# Patient Record
Sex: Female | Born: 1960 | Race: White | Hispanic: No | Marital: Married | Smoking: Never smoker
Health system: Southern US, Community
[De-identification: ages and names within clinical notes are randomized; demographics above are authoritative.]

## PROBLEM LIST (undated history)

## (undated) DIAGNOSIS — E0500 Thyrotoxicosis with diffuse goiter without thyrotoxic crisis or storm: Secondary | ICD-10-CM

## (undated) HISTORY — PX: OTHER SURGICAL HISTORY: SHX169

## (undated) HISTORY — PX: TONSILLECTOMY: SUR1361

---

## 1998-08-24 ENCOUNTER — Other Ambulatory Visit: Admission: RE | Admit: 1998-08-24 | Discharge: 1998-08-24 | Payer: Self-pay | Admitting: Radiology

## 1999-10-04 ENCOUNTER — Other Ambulatory Visit: Admission: RE | Admit: 1999-10-04 | Discharge: 1999-10-04 | Payer: Self-pay | Admitting: Obstetrics and Gynecology

## 2000-11-03 ENCOUNTER — Other Ambulatory Visit: Admission: RE | Admit: 2000-11-03 | Discharge: 2000-11-03 | Payer: Self-pay | Admitting: Obstetrics and Gynecology

## 2002-02-02 ENCOUNTER — Other Ambulatory Visit: Admission: RE | Admit: 2002-02-02 | Discharge: 2002-02-02 | Payer: Self-pay | Admitting: Obstetrics and Gynecology

## 2003-07-05 ENCOUNTER — Other Ambulatory Visit: Admission: RE | Admit: 2003-07-05 | Discharge: 2003-07-05 | Payer: Self-pay | Admitting: Obstetrics and Gynecology

## 2006-11-23 ENCOUNTER — Emergency Department (HOSPITAL_COMMUNITY): Admission: EM | Admit: 2006-11-23 | Discharge: 2006-11-23 | Payer: Self-pay | Admitting: Emergency Medicine

## 2007-09-20 IMAGING — CR DG ANKLE COMPLETE 3+V*L*
3 series · 3 of 3 positions shown · non-contrast
Comparison: None.

CLINICAL DATA: 45-year-old with ankle injury.  Diffuse left ankle pain after turning it today. 
LEFT ANKLE -  3 VIEW:

[t ankle joint ap left]
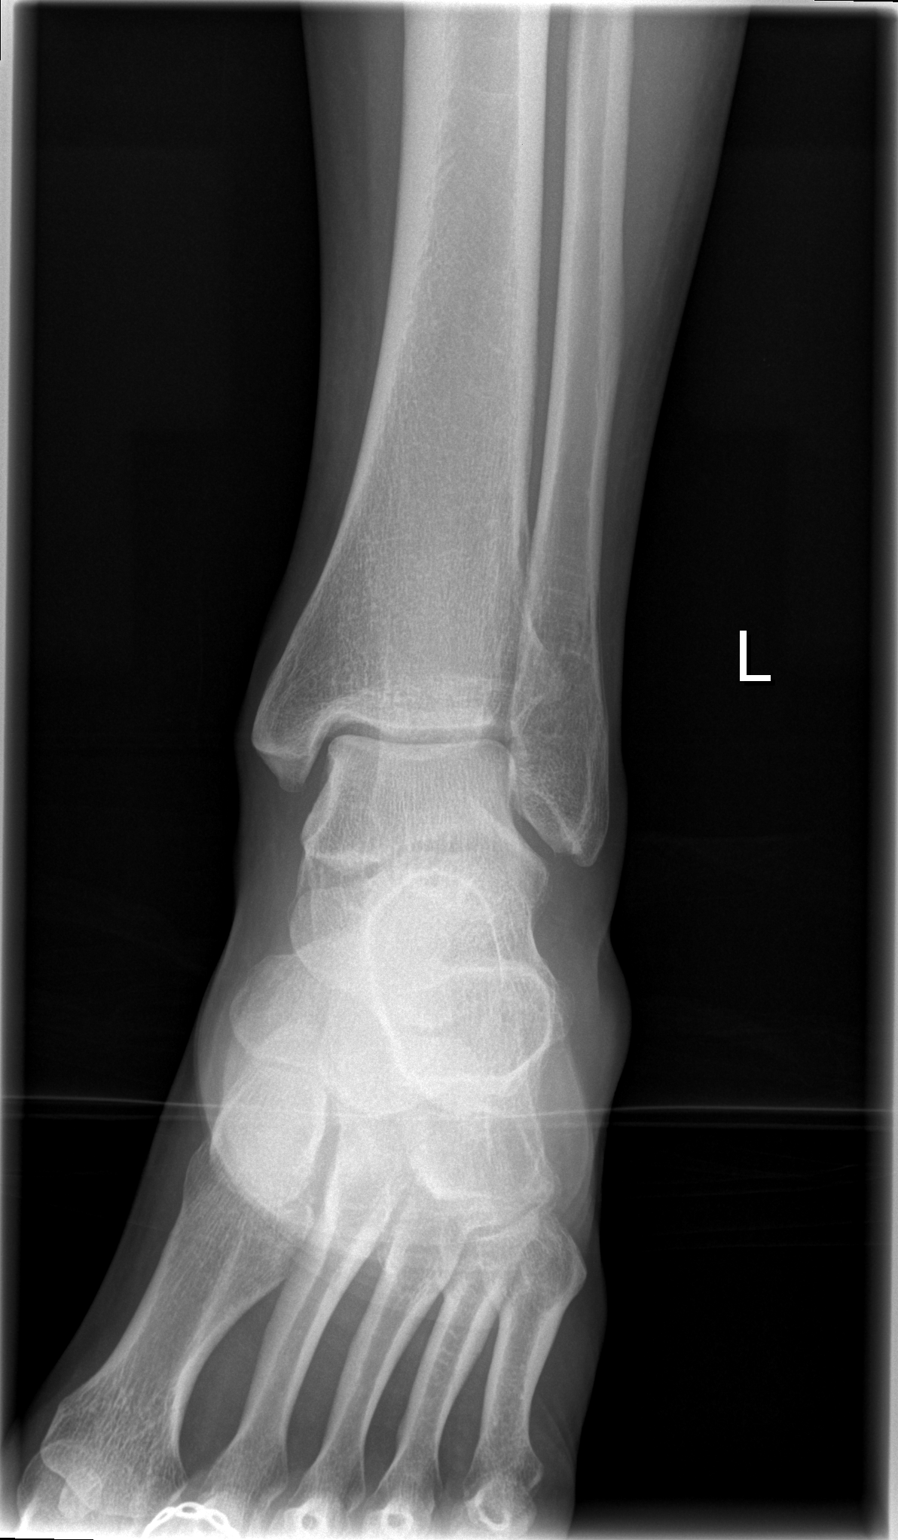

[t ankle joint oblique left]
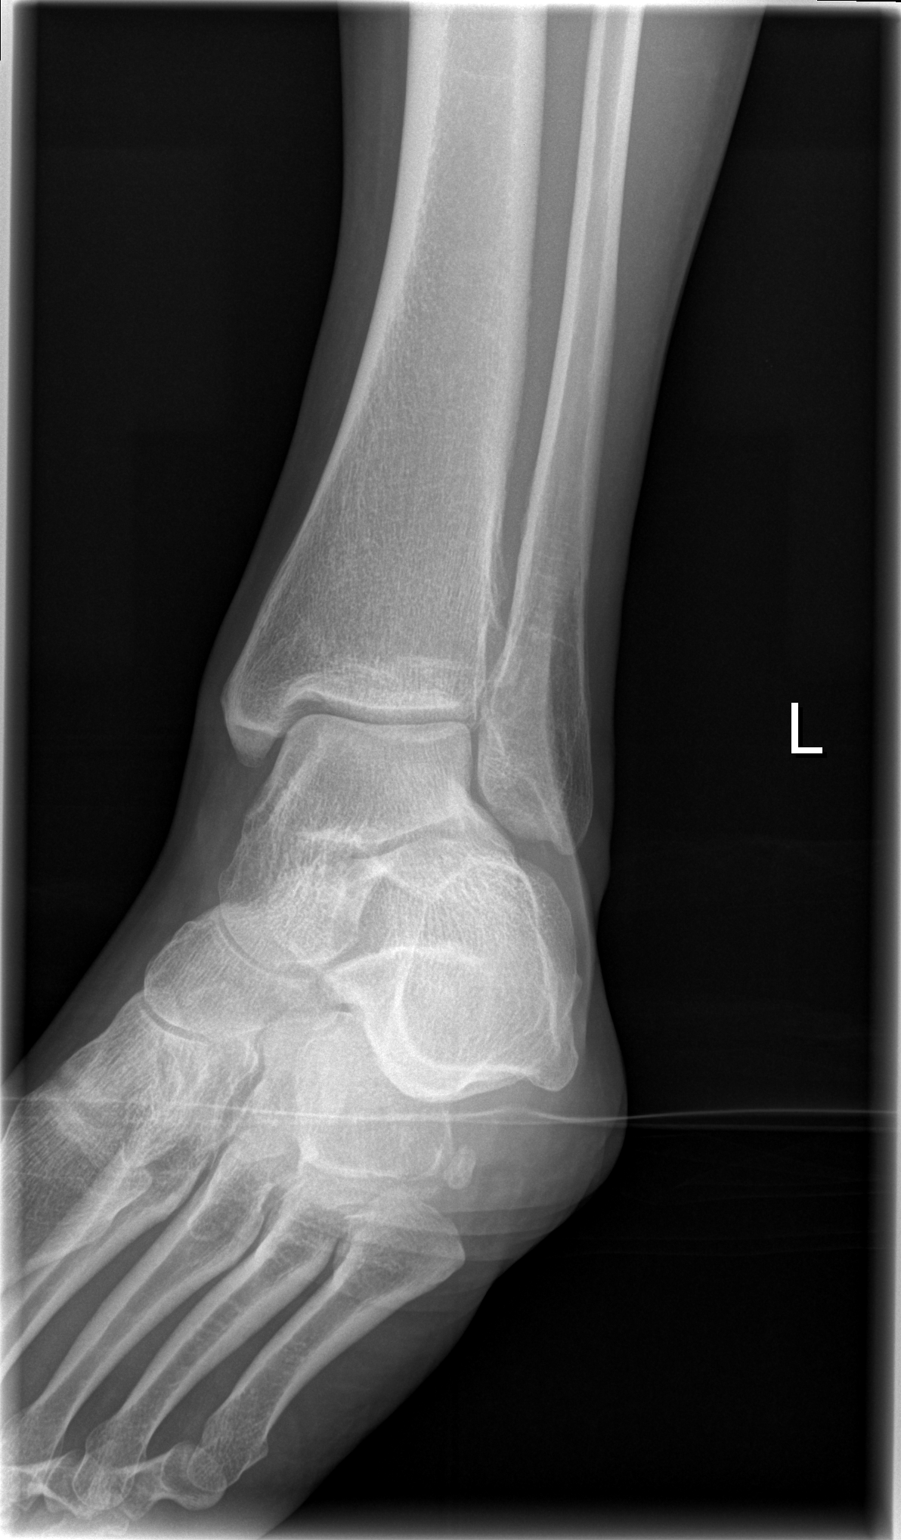

[t ankle joint lat left]
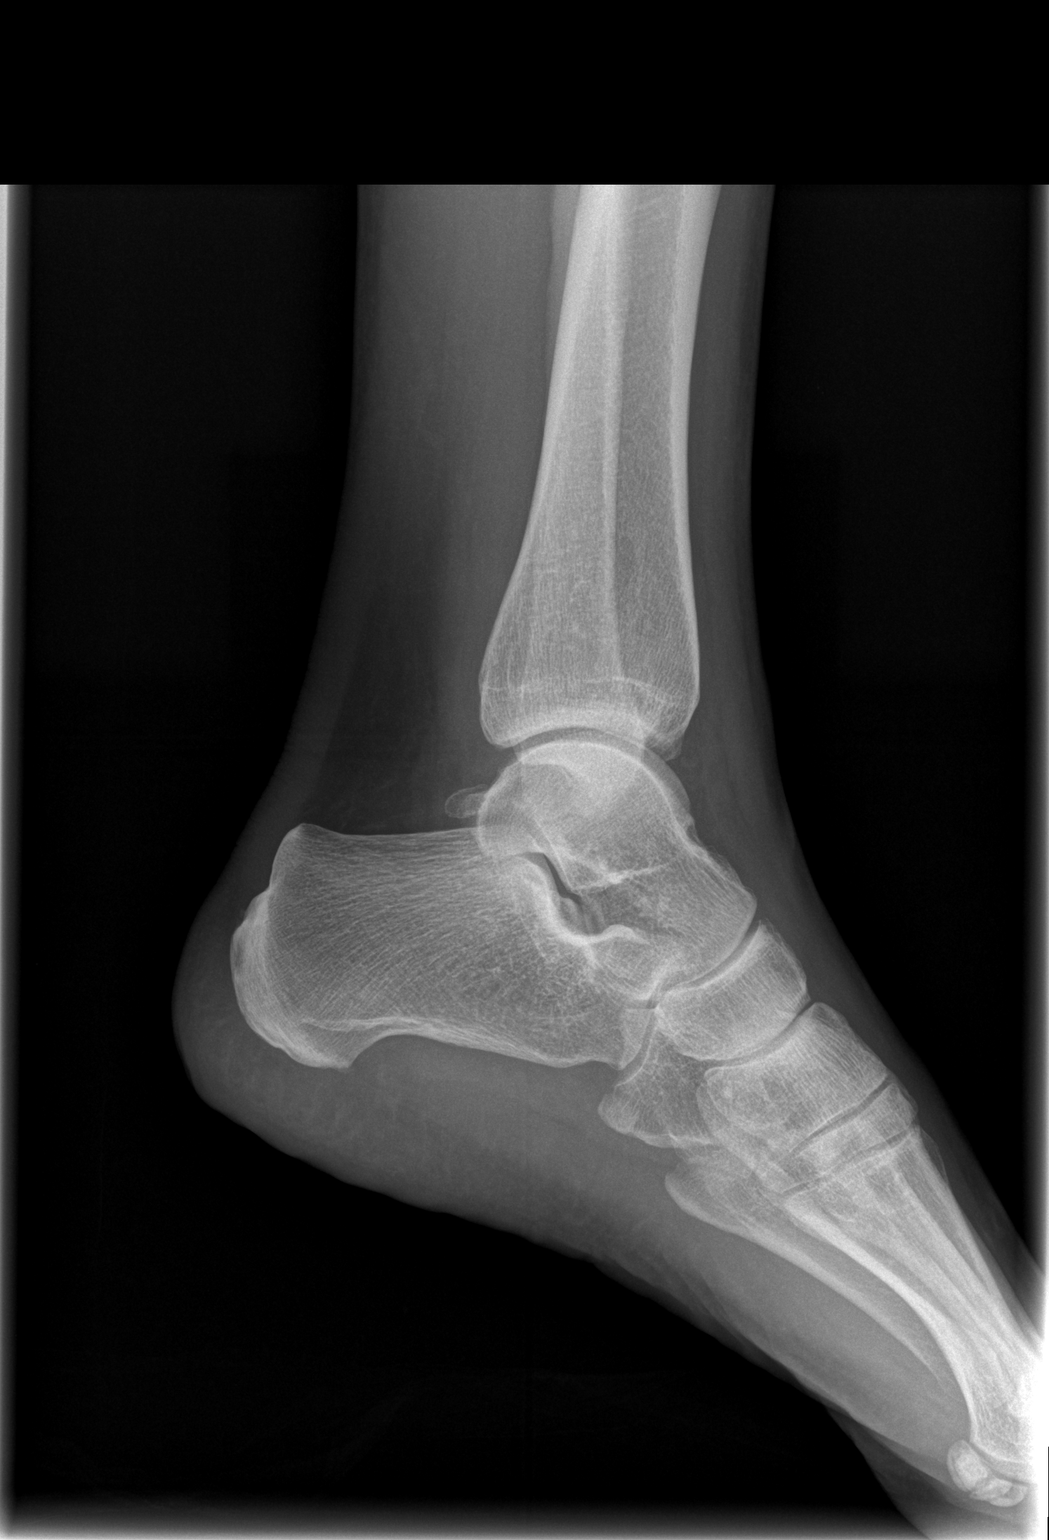

[3 of 3 positions shown; findings below may reference images not displayed]

FINDINGS: There is mild soft tissue swelling anterior to the ankle.   No evidence for acute fracture or dislocation, however.
IMPRESSION: Soft tissue swelling.

## 2013-03-23 ENCOUNTER — Encounter: Payer: Self-pay | Admitting: Internal Medicine

## 2013-06-10 ENCOUNTER — Encounter: Payer: Self-pay | Admitting: Internal Medicine

## 2014-08-15 ENCOUNTER — Emergency Department (HOSPITAL_COMMUNITY): Payer: 59

## 2014-08-15 ENCOUNTER — Emergency Department (HOSPITAL_COMMUNITY)
Admission: EM | Admit: 2014-08-15 | Discharge: 2014-08-15 | Disposition: A | Discharge status: Home / Self Care | Payer: 59 | Attending: Emergency Medicine | Admitting: Emergency Medicine

## 2014-08-15 DIAGNOSIS — Y9289 Other specified places as the place of occurrence of the external cause: Secondary | ICD-10-CM | POA: Insufficient documentation

## 2014-08-15 DIAGNOSIS — Y998 Other external cause status: Secondary | ICD-10-CM | POA: Diagnosis not present

## 2014-08-15 DIAGNOSIS — W01198A Fall on same level from slipping, tripping and stumbling with subsequent striking against other object, initial encounter: Secondary | ICD-10-CM | POA: Insufficient documentation

## 2014-08-15 DIAGNOSIS — S43014A Anterior dislocation of right humerus, initial encounter: Secondary | ICD-10-CM | POA: Diagnosis not present

## 2014-08-15 DIAGNOSIS — M25511 Pain in right shoulder: Secondary | ICD-10-CM

## 2014-08-15 DIAGNOSIS — S0181XA Laceration without foreign body of other part of head, initial encounter: Secondary | ICD-10-CM | POA: Insufficient documentation

## 2014-08-15 DIAGNOSIS — S4991XA Unspecified injury of right shoulder and upper arm, initial encounter: Secondary | ICD-10-CM | POA: Diagnosis present

## 2014-08-15 DIAGNOSIS — Z79899 Other long term (current) drug therapy: Secondary | ICD-10-CM | POA: Insufficient documentation

## 2014-08-15 DIAGNOSIS — Y9389 Activity, other specified: Secondary | ICD-10-CM | POA: Diagnosis not present

## 2014-08-15 DIAGNOSIS — W19XXXA Unspecified fall, initial encounter: Secondary | ICD-10-CM

## 2014-08-15 MED ORDER — KETAMINE HCL 10 MG/ML IJ SOLN
0.5000 mg/kg | Freq: Once | INTRAMUSCULAR | Status: AC
  Administered 2014-08-15: 30 mg via INTRAVENOUS
  Filled 2014-08-15 (×2): qty 3.8

## 2014-08-15 MED ORDER — SODIUM CHLORIDE 0.9 % IV SOLN
INTRAVENOUS | Status: DC
  Administered 2014-08-15: 14:00:00 via INTRAVENOUS

## 2014-08-15 MED ORDER — KETOROLAC TROMETHAMINE 15 MG/ML IJ SOLN
15.0000 mg | Freq: Once | INTRAMUSCULAR | Status: AC
  Administered 2014-08-15: 15 mg via INTRAVENOUS
  Filled 2014-08-15: qty 1

## 2014-08-15 MED ORDER — DIAZEPAM 5 MG/ML IJ SOLN
5.0000 mg | Freq: Once | INTRAMUSCULAR | Status: AC
  Administered 2014-08-15: 5 mg via INTRAVENOUS
  Filled 2014-08-15: qty 2

## 2014-08-15 MED ORDER — LIDOCAINE-EPINEPHRINE 1 %-1:100000 IJ SOLN
10.0000 mL | Freq: Once | INTRAMUSCULAR | Status: AC
  Administered 2014-08-15: 10 mL
  Filled 2014-08-15: qty 1

## 2014-08-15 MED ORDER — OXYCODONE-ACETAMINOPHEN 5-325 MG PO TABS: 1.0000 | ORAL_TABLET | ORAL | Status: DC | PRN

## 2014-08-15 MED ORDER — ONDANSETRON HCL 4 MG/2ML IJ SOLN
4.0000 mg | Freq: Once | INTRAMUSCULAR | Status: AC
  Administered 2014-08-15: 4 mg via INTRAVENOUS
  Filled 2014-08-15: qty 2

## 2014-08-15 MED ORDER — HYDROMORPHONE HCL 1 MG/ML IJ SOLN
1.0000 mg | Freq: Once | INTRAMUSCULAR | Status: AC
Start: 2014-08-15 — End: 2014-08-15
  Administered 2014-08-15: 1 mg via INTRAVENOUS
  Filled 2014-08-15: qty 1

## 2014-08-15 MED ORDER — PROPOFOL 10 MG/ML IV BOLUS
0.5000 mg/kg | Freq: Once | INTRAVENOUS | Status: AC
  Administered 2014-08-15: 30 mg via INTRAVENOUS
  Filled 2014-08-15: qty 1

## 2014-08-15 MED ORDER — FENTANYL CITRATE 0.05 MG/ML IJ SOLN
100.0000 ug | Freq: Once | INTRAMUSCULAR | Status: AC
  Administered 2014-08-15: 100 ug via INTRAVENOUS
  Filled 2014-08-15: qty 2

## 2014-08-15 NOTE — ED Provider Notes (Signed)
CSN: [id number]     Arrival date & time 08/15/14  1334 History   First MD Initiated Contact with Patient 08/15/14 1341     Chief Complaint  Patient presents with  . Fall  . Shoulder Injury     (Consider location/radiation/quality/duration/timing/severity/associated sxs/prior Treatment) Patient is a 54 y.o. female presenting with fall and shoulder injury.  Fall  Shoulder Injury   53yF with R shoulder pain. Larey Seat just before arrival onto R side. Severe pain/deformity to R shoulder since. Small laceration to chin. No LOC. No neck or back pain. No blood thinners. No numbness, tingling or focal loss of strength. No prior hx of significant shoulder problems.    No past medical history on file. No past surgical history on file. No family history on file. History  Substance Use Topics  . Smoking status: Not on file  . Smokeless tobacco: Not on file  . Alcohol Use: Not on file   OB History    No data available     Review of Systems  All systems reviewed and negative, other than as noted in HPI.   Allergies  Review of patient's allergies indicates no known allergies.  Home Medications   Prior to Admission medications   Medication Sig Start Date End Date Taking? Authorizing Provider  OVER THE COUNTER MEDICATION Place 1 patch onto the skin daily.   Yes Historical Provider, MD   BP 115/62 mmHg  Pulse 57  Temp(Src) 97.8 F (36.6 C) (Oral)  Resp 20  Ht  (1.803 m)  Wt 167 lb (75.751 kg)  BMI 23.30 kg/m2  SpO2 100%  LMP 07/29/2014 Physical Exam  Constitutional: She appears well-developed and well-nourished. No distress.  HENT:  Head: Normocephalic and atraumatic.  1cm horizontally oriented lac beneath chin. Gapes with facial expression. No active bleeding. No bony tenderness.   Eyes: Conjunctivae are normal. Right eye exhibits no discharge. Left eye exhibits no discharge.  Neck: Neck supple.  Cardiovascular: Normal rate, regular rhythm and normal heart sounds.   Exam reveals no gallop and no friction rub.   No murmur heard. Pulmonary/Chest: Effort normal and breath sounds normal. No respiratory distress.  Abdominal: Soft. She exhibits no distension. There is no tenderness.  Musculoskeletal: She exhibits no edema or tenderness.  Deformity R shoulder consistent with anterior dislocation. Holds R arm flexed to 90 degrees, internally rotated and supported with L hand. Resists any attempt to range it. NVI distally.   Neurological: She is alert.  Skin: Skin is warm and dry.  Psychiatric: She has a normal mood and affect. Her behavior is normal. Thought content normal.  Nursing note and vitals reviewed.   ED Course  Reduction of dislocation Date/Time: 08/15/2014 3:40 PM Performed by: Raeford Razor Authorized by: Raeford Razor Consent: Verbal consent obtained. Written consent obtained. Risks and benefits: risks, benefits and alternatives were discussed Consent given by: patient Required items: required blood products, implants, devices, and special equipment available Patient identity confirmed: verbally with patient, arm band and provided demographic data Local anesthesia used: no Patient sedated: yes Sedation type: moderate (conscious) sedation Vitals: Vital signs were monitored during sedation. Patient tolerance: Patient tolerated the procedure well with no immediate complications Comments: Easily reduced with external rotation. Improved appearance. Full passive ROM.    (including critical care time)  Procedural Sedation:  Preprocedure  Pre-anesthesia/induction confirmation of laterality/correct procedure site including "time-out."  Provider confirms review of the nurses' note, allergies, medications, pertinent labs, PMH, pre-induction vital signs, pulse oximetry, pain level, and  ECG (as applicable), and patient condition satisfactory for commencing with order for sedation and procedure.  Medications:   30mg  ketamine IV 30 mg propofol  IV  Patient tolerated procedure and procedural sedation component as expected without apparent immediate complications.  Physician confirms procedural medication orders as administered, patient was assessed by physician post-procedure, and confirms post-sedation plan of care and disposition.  Total time of sedation: monitoring 30 minutes  LACERATION REPAIR Performed by: Raeford RazorKOHUT, Larya Charpentier Authorized by: Raeford RazorKOHUT, Edric Fetterman Consent: Verbal consent obtained. Risks and benefits: risks, benefits and alternatives were discussed Consent given by: patient Patient identity confirmed: provided demographic data Prepped and Draped in normal sterile fashion Wound explored  Laceration Location: chin  Laceration Length: 1 cm  No Foreign Bodies seen or palpated  Anesthesia: local infiltration  Local anesthetic: lidocaine 1% w epinephrine  Anesthetic total: 1 ml  Irrigation method: syringe Amount of cleaning: standard  Skin closure: 6-0 prolene  Number of sutures: 2  Technique: simple interupted  Patient tolerance: Patient tolerated the procedure well with no immediate complications.     Labs Review Labs Reviewed - No data to display  Imaging Review Dg Shoulder Right  08/15/2014   CLINICAL DATA:  Injury today  EXAM: RIGHT SHOULDER - 2+ VIEW  COMPARISON:  None.  FINDINGS: The humeral head is completely dislocated anteriorly with respect to the glenoid. No fracture.  IMPRESSION: Anterior glenohumeral dislocation.   Electronically Signed   By: Maryclare BeanArt  Hoss M.D.   On: 08/15/2014 14:39   Dg Shoulder Right Port  08/15/2014   CLINICAL DATA:  Post reduction for anterior dislocation  EXAM: PORTABLE RIGHT SHOULDER - 2+ VIEW  COMPARISON:  August 15, 2014  FINDINGS: Frontal and Y scapular images were obtained. The previously noted anterior dislocation has been reduced successfully. Currently no fracture or dislocation is identified. There is no appreciable joint space narrowing. No erosive change.   IMPRESSION: Status post reduction of anterior dislocation. No acute fracture or dislocation apparent currently.   Electronically Signed   By: Bretta BangWilliam  Woodruff M.D.   On: 08/15/2014 16:18     EKG Interpretation None      MDM   Final diagnoses:  Fall  Right shoulder pain  Closed anterior dislocation of right shoulder, initial encounter  Chin laceration, initial encounter    54 year old female with closed right anterior shoulder dislocation. Reduced. Neurovascular intact.  Sling. Orthopedic follow-up. Small submental laceration which was repaired. Continued wound care.     Raeford RazorStephen Alinah Sheard, MD 08/15/14 [id number]

## 2014-08-15 NOTE — ED Notes (Signed)
Pt states stepping over dog gate, tripped and fell. Hit chin and landed on right arm. Pt believes rt arm is dislocated at this shoulder.

## 2014-08-15 NOTE — Discharge Instructions (Signed)
Shoulder Dislocation  Your shoulder is made up of three bones: the collar bone (clavicle); the shoulder blade (scapula), which includes the socket (glenoid cavity); and the upper arm bone (humerus). Your shoulder joint is the place where these bones meet. Strong, fibrous tissues hold these bones together (ligaments). Muscles and strong, fibrous tissues that connect the muscles to these bones (tendons) allow your arm to move through this joint. The range of motion of your shoulder joint is more extensive than most of your other joints, and the glenoid cavity is very shallow. That is the reason that your shoulder joint is one of the most unstable joints in your body. It is far more prone to dislocation than your other joints. Shoulder dislocation is when your humerus is forced out of your shoulder joint.  CAUSES  Shoulder dislocation is caused by a forceful impact on your shoulder. This impact usually is from an injury, such as a sports injury or a fall.  SYMPTOMS  Symptoms of shoulder dislocation include:  · Deformity of your shoulder.  · Intense pain.  · Inability to move your shoulder joint.  · Numbness, weakness, or tingling around your shoulder joint (your neck or down your arm).  · Bruising or swelling around your shoulder.  DIAGNOSIS  In order to diagnose a dislocated shoulder, your caregiver will perform a physical exam. Your caregiver also may have an X-ray exam done to see if you have any broken bones. Magnetic resonance imaging (MRI) is a procedure that sometimes is done to help your caregiver see any damage to the soft tissues around your shoulder, particularly your rotator cuff tendons. Additionally, your caregiver also may have electromyography done to measure the electrical discharges produced in your muscles if you have signs or symptoms of nerve damage.  TREATMENT  A shoulder dislocation is treated by placing the humerus back in the joint (reduction). Your caregiver does this either manually (closed  reduction), by moving your humerus back into the joint through manipulation, or through surgery (open reduction). When your humerus is back in place, severe pain should improve almost immediately.  You also may need to have surgery if you have a weak shoulder joint or ligaments, and you have recurring shoulder dislocations, despite rehabilitation. In rare cases, surgery is necessary if your nerves or blood vessels are damaged during the dislocation.  After your reduction, your arm will be placed in a shoulder immobilizer or sling to keep it from moving. Your caregiver will have you wear your shoulder immobilizer or sling for 3 days to 3 weeks, depending on how serious your dislocation is. When your shoulder immobilizer or sling is removed, your caregiver may prescribe physical therapy to help improve the range of motion in your shoulder joint.  HOME CARE INSTRUCTIONS   The following measures can help to reduce pain and speed up the healing process:  · Rest your injured joint. Do not move it. Avoid activities similar to the one that caused your injury.  · Apply ice to your injured joint for the first day or two after your reduction or as directed by your caregiver. Applying ice helps to reduce inflammation and pain.  ¨ Put ice in a plastic bag.  ¨ Place a towel between your skin and the bag.  ¨ Leave the ice on for 15-20 minutes at a time, every 2 hours while you are awake.  · Exercise your hand by squeezing a soft ball. This helps to eliminate stiffness and swelling in your hand and   wrist.  · Take over-the-counter or prescription medicine for pain or discomfort as told by your caregiver.  SEEK IMMEDIATE MEDICAL CARE IF:   · Your shoulder immobilizer or sling becomes damaged.  · Your pain becomes worse rather than better.  · You lose feeling in your arm or hand, or they become white and cold.  MAKE SURE YOU:   · Understand these instructions.  · Will watch your condition.  · Will get help right away if you are not  doing well or get worse.  Document Released: 04/09/2001 Document Revised: 11/29/2013 Document Reviewed: 05/05/2011  [employer]® Patient Information ©2015 [employer]. This information is not intended to replace advice given to you by your health care provider. Make sure you discuss any questions you have with your health care provider.

## 2015-06-12 IMAGING — DX DG SHOULDER 2+V PORT*R*
2 series · 2 of 2 positions shown · non-contrast
Comparison: August 15, 2014

CLINICAL DATA: Post reduction for anterior dislocation

EXAM:
PORTABLE RIGHT SHOULDER - 2+ VIEW

[shoulder ap]
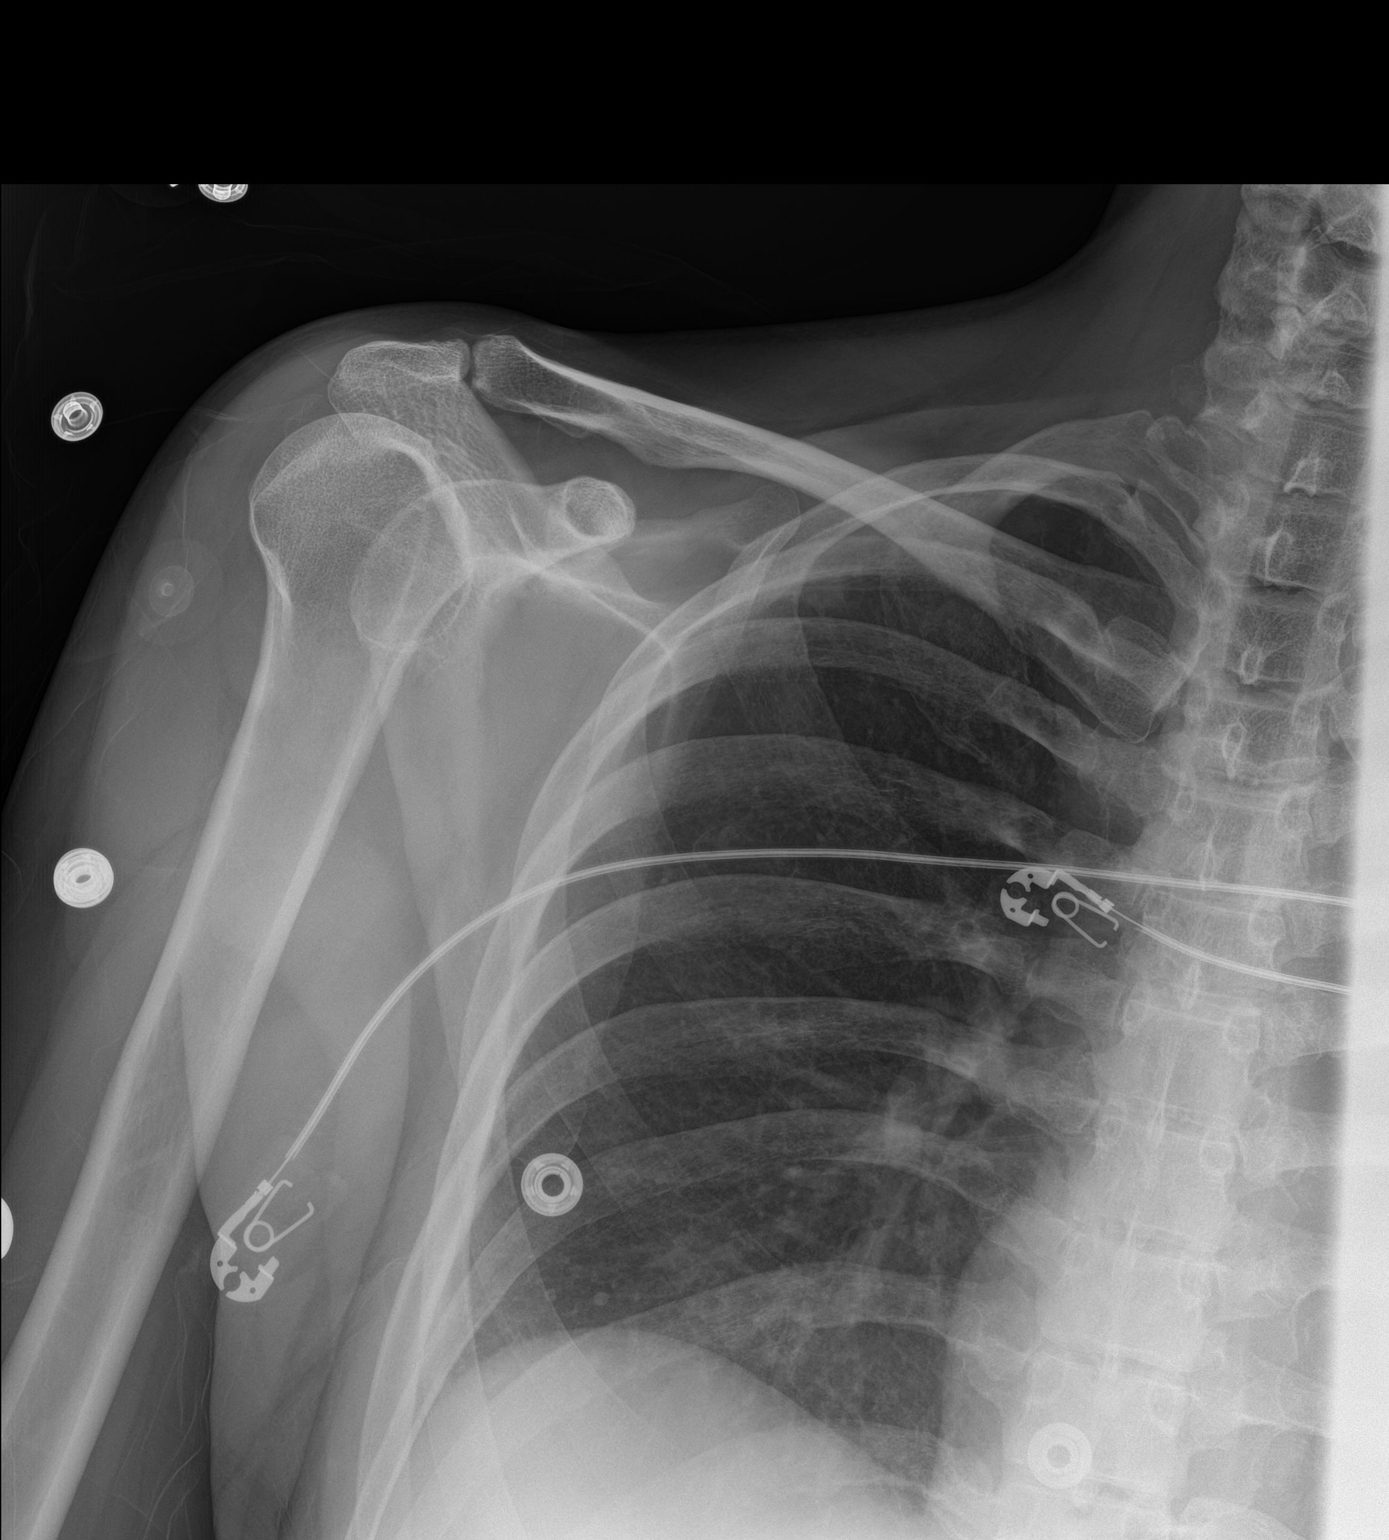

[shoulder obl]
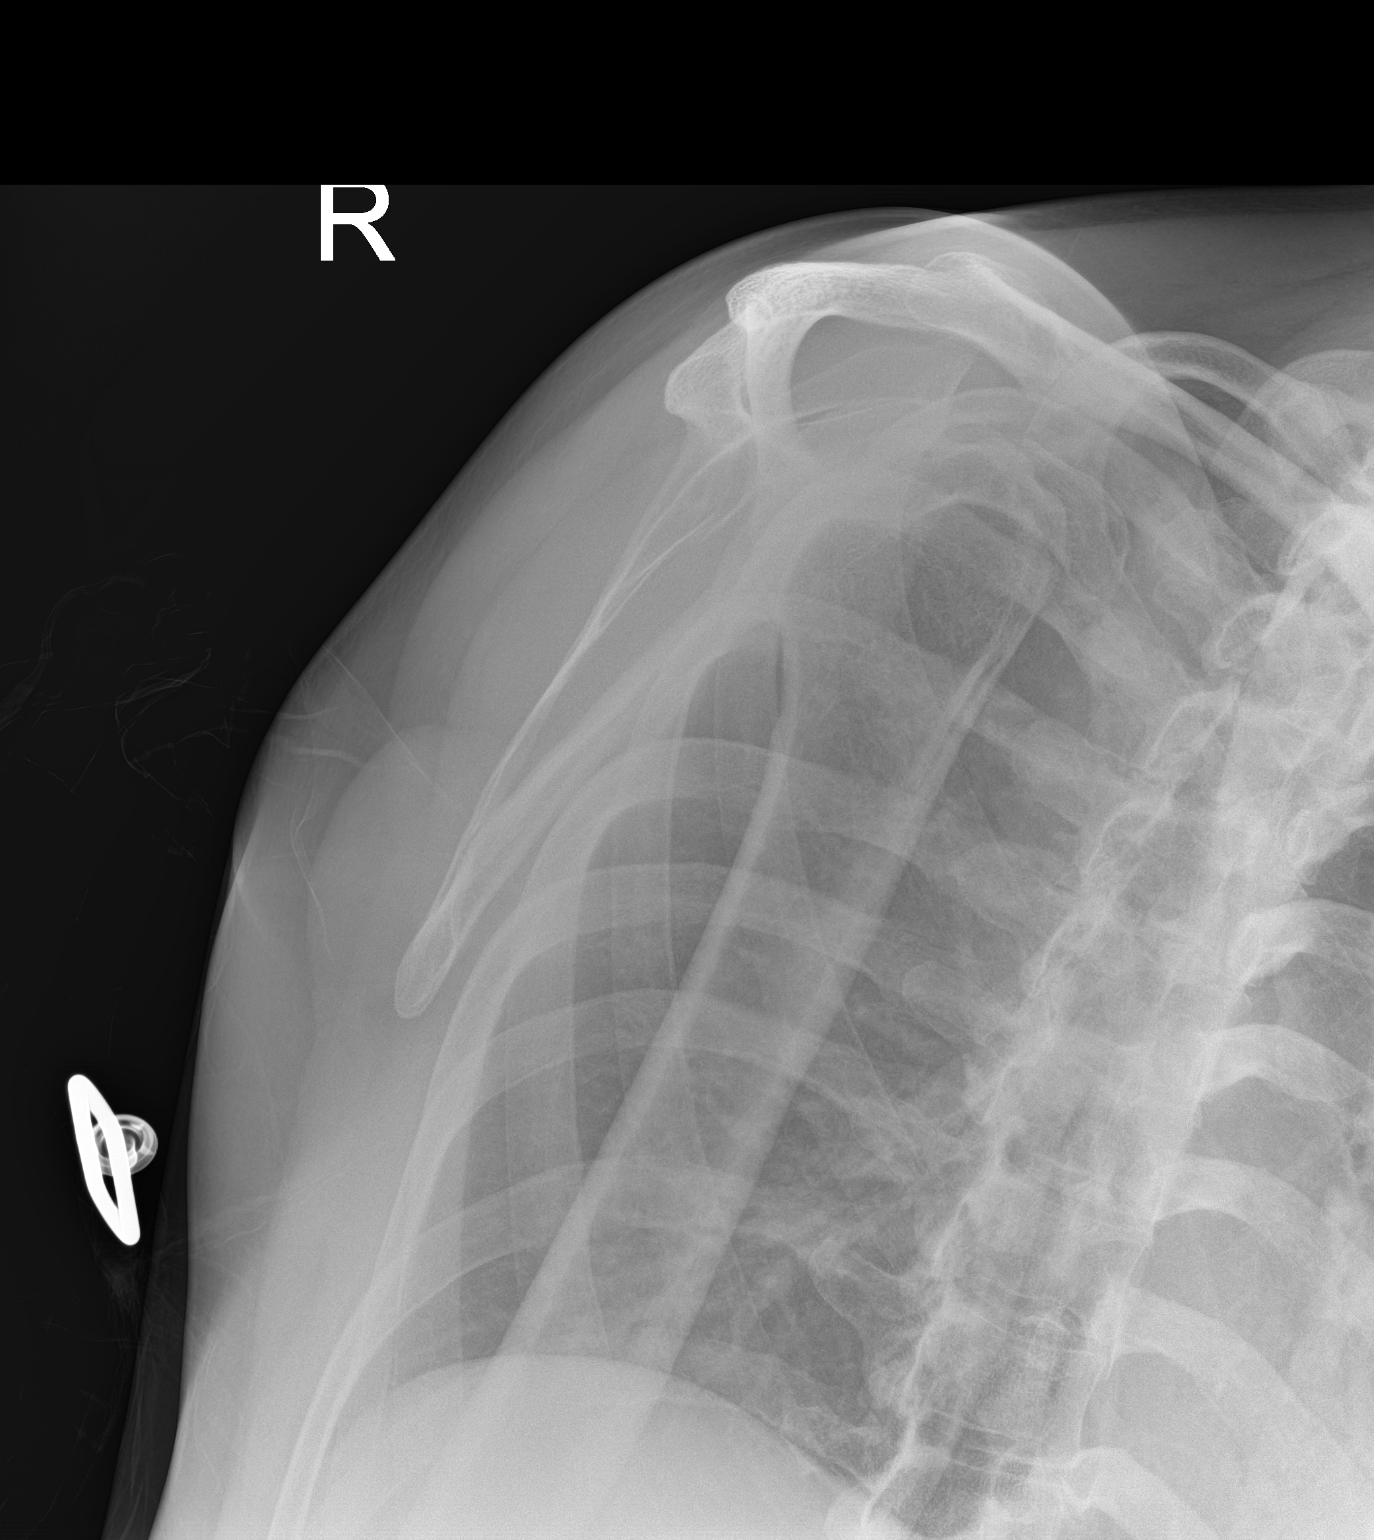

[2 of 2 positions shown; findings below may reference images not displayed]

FINDINGS: Frontal and Y scapular images were obtained. The previously noted
anterior dislocation has been reduced successfully. Currently no
fracture or dislocation is identified. There is no appreciable joint
space narrowing. No erosive change.
IMPRESSION: Status post reduction of anterior dislocation. No acute fracture or
dislocation apparent currently.

## 2019-05-04 ENCOUNTER — Other Ambulatory Visit: Payer: Self-pay

## 2019-05-04 DIAGNOSIS — Z20822 Contact with and (suspected) exposure to covid-19: Secondary | ICD-10-CM

## 2019-05-06 LAB — NOVEL CORONAVIRUS, NAA: SARS-CoV-2, NAA: NOT DETECTED

## 2019-05-25 ENCOUNTER — Other Ambulatory Visit: Payer: Self-pay | Admitting: Registered"

## 2019-05-25 DIAGNOSIS — Z20822 Contact with and (suspected) exposure to covid-19: Secondary | ICD-10-CM

## 2019-05-27 LAB — NOVEL CORONAVIRUS, NAA: SARS-CoV-2, NAA: NOT DETECTED

## 2021-08-09 DIAGNOSIS — Z0000 Encounter for general adult medical examination without abnormal findings: Secondary | ICD-10-CM | POA: Diagnosis not present

## 2022-03-08 DIAGNOSIS — R69 Illness, unspecified: Secondary | ICD-10-CM | POA: Diagnosis not present

## 2022-03-15 DIAGNOSIS — R69 Illness, unspecified: Secondary | ICD-10-CM | POA: Diagnosis not present

## 2022-04-15 DIAGNOSIS — R69 Illness, unspecified: Secondary | ICD-10-CM | POA: Diagnosis not present

## 2022-04-23 ENCOUNTER — Other Ambulatory Visit: Payer: Self-pay | Admitting: Internal Medicine

## 2022-04-23 DIAGNOSIS — E010 Iodine-deficiency related diffuse (endemic) goiter: Secondary | ICD-10-CM

## 2022-04-25 ENCOUNTER — Ambulatory Visit
Admission: RE | Admit: 2022-04-25 | Discharge: 2022-04-25 | Disposition: A | Discharge status: Home / Self Care | Payer: 59 | Source: Ambulatory Visit | Attending: Internal Medicine | Admitting: Internal Medicine

## 2022-04-25 DIAGNOSIS — E010 Iodine-deficiency related diffuse (endemic) goiter: Secondary | ICD-10-CM

## 2022-04-25 DIAGNOSIS — E041 Nontoxic single thyroid nodule: Secondary | ICD-10-CM | POA: Diagnosis not present

## 2022-05-16 DIAGNOSIS — E041 Nontoxic single thyroid nodule: Secondary | ICD-10-CM | POA: Diagnosis not present

## 2022-05-16 DIAGNOSIS — R221 Localized swelling, mass and lump, neck: Secondary | ICD-10-CM | POA: Diagnosis not present

## 2022-05-17 ENCOUNTER — Other Ambulatory Visit (HOSPITAL_BASED_OUTPATIENT_CLINIC_OR_DEPARTMENT_OTHER): Payer: Self-pay | Admitting: Home Modifications

## 2022-05-17 ENCOUNTER — Ambulatory Visit (HOSPITAL_BASED_OUTPATIENT_CLINIC_OR_DEPARTMENT_OTHER)
Admission: RE | Admit: 2022-05-17 | Discharge: 2022-05-17 | Disposition: A | Discharge status: Home / Self Care | Payer: 59 | Source: Ambulatory Visit | Attending: Home Modifications | Admitting: Home Modifications

## 2022-05-17 ENCOUNTER — Other Ambulatory Visit (HOSPITAL_COMMUNITY): Payer: Self-pay | Admitting: Home Modifications

## 2022-05-17 ENCOUNTER — Other Ambulatory Visit: Payer: Self-pay | Admitting: Home Modifications

## 2022-05-17 ENCOUNTER — Encounter (HOSPITAL_BASED_OUTPATIENT_CLINIC_OR_DEPARTMENT_OTHER): Payer: Self-pay

## 2022-05-17 DIAGNOSIS — R221 Localized swelling, mass and lump, neck: Secondary | ICD-10-CM | POA: Diagnosis not present

## 2022-05-17 DIAGNOSIS — E041 Nontoxic single thyroid nodule: Secondary | ICD-10-CM | POA: Diagnosis not present

## 2022-05-17 DIAGNOSIS — R220 Localized swelling, mass and lump, head: Secondary | ICD-10-CM | POA: Diagnosis not present

## 2022-05-17 MED ORDER — IOHEXOL 300 MG/ML  SOLN
75.0000 mL | Freq: Once | INTRAMUSCULAR | Status: AC | PRN
  Administered 2022-05-17: 65 mL via INTRAVENOUS

## 2022-05-20 LAB — POCT I-STAT CREATININE: Creatinine, Ser: 1.3 mg/dL — ABNORMAL HIGH (ref 0.44–1.00)

## 2022-06-25 DIAGNOSIS — Q892 Congenital malformations of other endocrine glands: Secondary | ICD-10-CM | POA: Diagnosis not present

## 2022-08-12 DIAGNOSIS — N289 Disorder of kidney and ureter, unspecified: Secondary | ICD-10-CM | POA: Diagnosis not present

## 2022-08-19 DIAGNOSIS — Z524 Kidney donor: Secondary | ICD-10-CM | POA: Diagnosis not present

## 2022-08-19 DIAGNOSIS — R1012 Left upper quadrant pain: Secondary | ICD-10-CM | POA: Diagnosis not present

## 2022-08-19 DIAGNOSIS — B351 Tinea unguium: Secondary | ICD-10-CM | POA: Diagnosis not present

## 2022-08-19 DIAGNOSIS — Z7184 Encounter for health counseling related to travel: Secondary | ICD-10-CM | POA: Diagnosis not present

## 2022-08-19 DIAGNOSIS — Z0000 Encounter for general adult medical examination without abnormal findings: Secondary | ICD-10-CM | POA: Diagnosis not present

## 2022-08-19 DIAGNOSIS — N289 Disorder of kidney and ureter, unspecified: Secondary | ICD-10-CM | POA: Diagnosis not present

## 2022-08-19 DIAGNOSIS — Z8509 Personal history of malignant neoplasm of other digestive organs: Secondary | ICD-10-CM | POA: Diagnosis not present

## 2022-09-17 DIAGNOSIS — R1110 Vomiting, unspecified: Secondary | ICD-10-CM | POA: Diagnosis not present

## 2022-09-17 DIAGNOSIS — Z79899 Other long term (current) drug therapy: Secondary | ICD-10-CM | POA: Diagnosis not present

## 2022-09-25 ENCOUNTER — Other Ambulatory Visit: Payer: Self-pay | Admitting: Internal Medicine

## 2022-09-25 DIAGNOSIS — R110 Nausea: Secondary | ICD-10-CM | POA: Diagnosis not present

## 2022-09-25 DIAGNOSIS — R109 Unspecified abdominal pain: Secondary | ICD-10-CM | POA: Diagnosis not present

## 2022-10-01 DIAGNOSIS — B353 Tinea pedis: Secondary | ICD-10-CM | POA: Diagnosis not present

## 2022-10-01 DIAGNOSIS — B351 Tinea unguium: Secondary | ICD-10-CM | POA: Diagnosis not present

## 2022-10-01 DIAGNOSIS — L858 Other specified epidermal thickening: Secondary | ICD-10-CM | POA: Diagnosis not present

## 2022-10-03 DIAGNOSIS — M25473 Effusion, unspecified ankle: Secondary | ICD-10-CM | POA: Diagnosis not present

## 2022-10-07 DIAGNOSIS — R251 Tremor, unspecified: Secondary | ICD-10-CM | POA: Diagnosis not present

## 2022-10-09 DIAGNOSIS — M47817 Spondylosis without myelopathy or radiculopathy, lumbosacral region: Secondary | ICD-10-CM | POA: Diagnosis not present

## 2022-10-09 DIAGNOSIS — R1084 Generalized abdominal pain: Secondary | ICD-10-CM | POA: Diagnosis not present

## 2022-10-09 DIAGNOSIS — E0590 Thyrotoxicosis, unspecified without thyrotoxic crisis or storm: Secondary | ICD-10-CM | POA: Diagnosis not present

## 2022-10-09 DIAGNOSIS — D649 Anemia, unspecified: Secondary | ICD-10-CM | POA: Diagnosis not present

## 2022-10-09 DIAGNOSIS — M47816 Spondylosis without myelopathy or radiculopathy, lumbar region: Secondary | ICD-10-CM | POA: Diagnosis not present

## 2022-10-09 DIAGNOSIS — R9431 Abnormal electrocardiogram [ECG] [EKG]: Secondary | ICD-10-CM | POA: Diagnosis not present

## 2022-10-09 DIAGNOSIS — I959 Hypotension, unspecified: Secondary | ICD-10-CM | POA: Diagnosis not present

## 2022-10-09 DIAGNOSIS — R112 Nausea with vomiting, unspecified: Secondary | ICD-10-CM | POA: Diagnosis not present

## 2022-10-09 DIAGNOSIS — Z903 Acquired absence of stomach [part of]: Secondary | ICD-10-CM | POA: Diagnosis not present

## 2022-10-09 DIAGNOSIS — R634 Abnormal weight loss: Secondary | ICD-10-CM | POA: Diagnosis not present

## 2022-10-09 DIAGNOSIS — R109 Unspecified abdominal pain: Secondary | ICD-10-CM | POA: Diagnosis not present

## 2022-10-09 DIAGNOSIS — F419 Anxiety disorder, unspecified: Secondary | ICD-10-CM | POA: Diagnosis not present

## 2022-10-10 DIAGNOSIS — R9431 Abnormal electrocardiogram [ECG] [EKG]: Secondary | ICD-10-CM | POA: Diagnosis not present

## 2022-10-10 DIAGNOSIS — E0590 Thyrotoxicosis, unspecified without thyrotoxic crisis or storm: Secondary | ICD-10-CM | POA: Diagnosis not present

## 2022-10-10 DIAGNOSIS — E041 Nontoxic single thyroid nodule: Secondary | ICD-10-CM | POA: Diagnosis not present

## 2022-10-30 DIAGNOSIS — B351 Tinea unguium: Secondary | ICD-10-CM | POA: Diagnosis not present

## 2022-10-30 DIAGNOSIS — B353 Tinea pedis: Secondary | ICD-10-CM | POA: Diagnosis not present

## 2022-11-05 DIAGNOSIS — E0590 Thyrotoxicosis, unspecified without thyrotoxic crisis or storm: Secondary | ICD-10-CM | POA: Diagnosis not present

## 2022-11-12 DIAGNOSIS — E0590 Thyrotoxicosis, unspecified without thyrotoxic crisis or storm: Secondary | ICD-10-CM | POA: Diagnosis not present

## 2022-12-25 DIAGNOSIS — E0590 Thyrotoxicosis, unspecified without thyrotoxic crisis or storm: Secondary | ICD-10-CM | POA: Diagnosis not present

## 2022-12-31 DIAGNOSIS — Q892 Congenital malformations of other endocrine glands: Secondary | ICD-10-CM | POA: Diagnosis not present

## 2023-02-05 DIAGNOSIS — E0590 Thyrotoxicosis, unspecified without thyrotoxic crisis or storm: Secondary | ICD-10-CM | POA: Diagnosis not present

## 2023-02-10 DIAGNOSIS — Q892 Congenital malformations of other endocrine glands: Secondary | ICD-10-CM | POA: Diagnosis not present

## 2023-02-10 DIAGNOSIS — E0500 Thyrotoxicosis with diffuse goiter without thyrotoxic crisis or storm: Secondary | ICD-10-CM | POA: Diagnosis not present

## 2023-02-10 DIAGNOSIS — E0590 Thyrotoxicosis, unspecified without thyrotoxic crisis or storm: Secondary | ICD-10-CM | POA: Diagnosis not present

## 2023-02-11 DIAGNOSIS — B351 Tinea unguium: Secondary | ICD-10-CM | POA: Diagnosis not present

## 2023-03-10 DIAGNOSIS — E0590 Thyrotoxicosis, unspecified without thyrotoxic crisis or storm: Secondary | ICD-10-CM | POA: Diagnosis not present

## 2023-03-18 DIAGNOSIS — E0500 Thyrotoxicosis with diffuse goiter without thyrotoxic crisis or storm: Secondary | ICD-10-CM | POA: Diagnosis not present

## 2023-03-18 DIAGNOSIS — Q892 Congenital malformations of other endocrine glands: Secondary | ICD-10-CM | POA: Diagnosis not present

## 2023-03-19 ENCOUNTER — Ambulatory Visit: Payer: Self-pay | Admitting: Surgery

## 2023-04-15 NOTE — Patient Instructions (Signed)
SURGICAL WAITING ROOM VISITATION  Patients having surgery or a procedure may have no more than 2 support people in the waiting area - these visitors may rotate.    Children under the age of 69 must have an adult with them who is not the patient.  Due to an increase in RSV and influenza rates and associated hospitalizations, children ages 35 and under may not visit patients in [facility] hospitals.  If the patient needs to stay at the hospital during part of their recovery, the visitor guidelines for inpatient rooms apply. Pre-op nurse will coordinate an appropriate time for 1 support person to accompany patient in pre-op.  This support person may not rotate.    Please refer to the [facility] website for the visitor guidelines for Inpatients (after your surgery is over and you are in a regular room).       Your procedure is scheduled on:  04/21/23    Report to [facility] Main Entrance    Report to admitting at  0515 AM   Call this number if you have problems the morning of surgery [phone]   Do not eat food :After Midnight.   After Midnight you may have the following liquids until __ 0430____ AM  DAY OF SURGERY  Water Non-Citrus Juices (without pulp, NO RED-Apple, White grape, White cranberry) Black Coffee (NO MILK/CREAM OR CREAMERS, sugar ok)  Clear Tea (NO MILK/CREAM OR CREAMERS, sugar ok) regular and decaf                             Plain Jell-O (NO RED)                                           Fruit ices (not with fruit pulp, NO RED)                                     Popsicles (NO RED)                                                               Sports drinks like Gatorade (NO RED)                    The day of surgery:  Drink ONE (1) Pre-Surgery Clear Ensure or G2 at  0430AM the morning of surgery. Drink in one sitting. Do not sip.  This drink was given to you during your hospital  pre-op appointment visit. Nothing else to drink after completing the   Pre-Surgery Clear Ensure or G2.          If you have questions, please contact your surgeon's office.        Oral Hygiene is also important to reduce your risk of infection.                                    Remember - BRUSH YOUR TEETH THE MORNING OF SURGERY WITH YOUR REGULAR TOOTHPASTE  DENTURES  WILL BE REMOVED PRIOR TO SURGERY PLEASE DO NOT APPLY "Poly grip" OR ADHESIVES!!!   Do NOT smoke after Midnight   Stop all vitamins and herbal supplements 7 days before surgery.   Take these medicines the morning of surgery with A SIP OF WATER:  tapazole  DO NOT TAKE ANY ORAL DIABETIC MEDICATIONS DAY OF YOUR SURGERY  Bring CPAP mask and tubing day of surgery.                              You may not have any metal on your body including hair pins, jewelry, and body piercing             Do not wear make-up, lotions, powders, perfumes/cologne, or deodorant  Do not wear nail polish including gel and S&S, artificial/acrylic nails, or any other type of covering on natural nails including finger and toenails. If you have artificial nails, gel coating, etc. that needs to be removed by a nail salon please have this removed prior to surgery or surgery may need to be canceled/ delayed if the surgeon/ anesthesia feels like they are unable to be safely monitored.   Do not shave  48 hours prior to surgery.               Men may shave face and neck.   Do not bring valuables to the hospital. [facility] IS NOT             RESPONSIBLE   FOR VALUABLES.   Contacts, glasses, dentures or bridgework may not be worn into surgery.   Bring small overnight bag day of surgery.   DO NOT BRING YOUR HOME MEDICATIONS TO THE HOSPITAL. PHARMACY WILL DISPENSE MEDICATIONS LISTED ON YOUR MEDICATION LIST TO YOU DURING YOUR ADMISSION IN THE HOSPITAL!    Patients discharged on the day of surgery will not be allowed to drive home.  Someone NEEDS to stay with you for the first 24 hours after anesthesia.   Special  Instructions: Bring a copy of your healthcare power of attorney and living will documents the day of surgery if you haven't scanned them before.              Please read over the following fact sheets you were given: IF YOU HAVE QUESTIONS ABOUT YOUR PRE-OP INSTRUCTIONS PLEASE CALL [phone]   If you received a COVID test during your pre-op visit  it is requested that you wear a mask when out in public, stay away from anyone that may not be feeling well and notify your surgeon if you develop symptoms. If you test positive for Covid or have been in contact with anyone that has tested positive in the last 10 days please notify you surgeon.    [facility] - Preparing for Surgery Before surgery, you can play an important role.  Because skin is not sterile, your skin needs to be as free of germs as possible.  You can reduce the number of germs on your skin by washing with CHG (chlorahexidine gluconate) soap before surgery.  CHG is an antiseptic cleaner which kills germs and bonds with the skin to continue killing germs even after washing. Please DO NOT use if you have an allergy to CHG or antibacterial soaps.  If your skin becomes reddened/irritated stop using the CHG and inform your nurse when you arrive at Short Stay. Do not shave (including legs and underarms) for at least 48 hours  prior to the first CHG shower.  You may shave your face/neck. Please follow these instructions carefully:  1.  Shower with CHG Soap the night before surgery and the  morning of Surgery.  2.  If you choose to wash your hair, wash your hair first as usual with your  normal  shampoo.  3.  After you shampoo, rinse your hair and body thoroughly to remove the  shampoo.                           4.  Use CHG as you would any other liquid soap.  You can apply chg directly  to the skin and wash                       Gently with a scrungie or clean washcloth.  5.  Apply the CHG Soap to your body ONLY FROM THE NECK DOWN.   Do not use  on face/ open                           Wound or open sores. Avoid contact with eyes, ears mouth and genitals (private parts).                       Wash face,  Genitals (private parts) with your normal soap.             6.  Wash thoroughly, paying special attention to the area where your surgery  will be performed.  7.  Thoroughly rinse your body with warm water from the neck down.  8.  DO NOT shower/wash with your normal soap after using and rinsing off  the CHG Soap.                9.  Pat yourself dry with a clean towel.            10.  Wear clean pajamas.            11.  Place clean sheets on your bed the night of your first shower and do not  sleep with pets. Day of Surgery : Do not apply any lotions/deodorants the morning of surgery.  Please wear clean clothes to the hospital/surgery center.  FAILURE TO FOLLOW THESE INSTRUCTIONS MAY RESULT IN THE CANCELLATION OF YOUR SURGERY PATIENT SIGNATURE_________________________________  NURSE SIGNATURE__________________________________  ________________________________________________________________________

## 2023-04-16 ENCOUNTER — Encounter (HOSPITAL_COMMUNITY): Payer: Self-pay

## 2023-04-16 ENCOUNTER — Encounter (HOSPITAL_COMMUNITY)
Admission: RE | Admit: 2023-04-16 | Discharge: 2023-04-16 | Disposition: A | Discharge status: Home / Self Care | Payer: 59 | Source: Ambulatory Visit | Attending: Surgery | Admitting: Surgery

## 2023-04-16 ENCOUNTER — Other Ambulatory Visit: Payer: Self-pay

## 2023-04-16 VITALS — BP 116/81 | HR 66 | Temp 97.7°F | Resp 16 | Ht 70.0 in | Wt 162.0 lb

## 2023-04-16 DIAGNOSIS — Z01818 Encounter for other preprocedural examination: Secondary | ICD-10-CM | POA: Diagnosis not present

## 2023-04-16 DIAGNOSIS — Z01812 Encounter for preprocedural laboratory examination: Secondary | ICD-10-CM | POA: Insufficient documentation

## 2023-04-16 HISTORY — DX: Thyrotoxicosis with diffuse goiter without thyrotoxic crisis or storm: E05.00

## 2023-04-16 LAB — CBC
HCT: 40.6 % (ref 36.0–46.0)
Hemoglobin: 13.5 g/dL (ref 12.0–15.0)
MCH: 27.6 pg (ref 26.0–34.0)
MCHC: 33.3 g/dL (ref 30.0–36.0)
MCV: 82.9 fL (ref 80.0–100.0)
Platelets: 279 10*3/uL (ref 150–400)
RBC: 4.9 MIL/uL (ref 3.87–5.11)
RDW: 13.1 % (ref 11.5–15.5)
WBC: 5.5 10*3/uL (ref 4.0–10.5)
nRBC: 0 % (ref 0.0–0.2)

## 2023-04-16 NOTE — Progress Notes (Addendum)
Anesthesia Review:  PCP: DR Merri Brunette  Cardiologist : none  Chest x-ray : EKG : Echo : Stress test: Cardiac Cath :  Activity level: can do a flight of stairs without difficutly  Sleep Study/ CPAP : none  Fasting Blood Sugar :      / Checks Blood Sugar -- times a day:   Blood Thinner/ Instructions /Last Dose: ASA / Instructions/ Last Dose :

## 2023-04-18 ENCOUNTER — Encounter (HOSPITAL_COMMUNITY): Payer: Self-pay | Admitting: Surgery

## 2023-04-18 DIAGNOSIS — E0500 Thyrotoxicosis with diffuse goiter without thyrotoxic crisis or storm: Secondary | ICD-10-CM | POA: Diagnosis present

## 2023-04-18 DIAGNOSIS — Q892 Congenital malformations of other endocrine glands: Secondary | ICD-10-CM

## 2023-04-18 NOTE — H&P (Signed)
REFERRING PHYSICIAN: Lelan Pons, MD  PROVIDER: Brek Reece Myra Rude, MD   Chief Complaint: New Consultation Kim Cobb' disease)  History of Present Illness:  Patient is referred by Dr. Dorisann Frames for surgical evaluation and management of Graves' disease. Patient's primary care physician is Dr. Merri Brunette. Patient developed signs and symptoms of Graves' disease around the first of the year. She developed a significant tremor. She experienced approximately a 30 pound weight loss. She was evaluated by her primary care physician and diagnosed with Graves' disease. She was referred to her endocrinologist and started on methimazole. Patient's dosage has been decreased to 10 mg daily which is controlling her symptoms. She has discussed options for management including radioactive iodine ablation. Patient is a kidney donor in 2018. She is concerned about doing radioactive iodine. Also the patient had been found on examination to have a cyst in the upper midline of the anterior neck. She underwent a CT scan in October 2023 which shows a 2.3 cm cyst with a small amount of soft tissue density in the midline below the level of the hyoid bone consistent with a thyroglossal duct cyst. She was seen by ENT and made a decision not to have this surgically resected. She has no history of infection. However, now that she is considering thyroidectomy for management of Graves' disease, she is interested in having the cyst resected concurrently. Patient has no prior history of head or neck surgery. She has no prior history of thyroid disease. There is no family history of thyroid disease. Patient presents today accompanied by her husband. Patient did undergo a thyroid ultrasound in September 2023. This showed an essentially normal sized thyroid gland. There were 2 small nodules located in both thyroid lobes which were either cystic or did not meet criteria for biopsy.  Review of Systems: A complete review of  systems was obtained from the patient. I have reviewed this information and discussed as appropriate with the patient. See HPI as well for other ROS.  Review of Systems  Constitutional: Positive for weight loss.  HENT: Negative.  Eyes: Negative.  Respiratory: Negative.  Cardiovascular: Negative.  Gastrointestinal: Negative.  Genitourinary: Negative.  Musculoskeletal: Negative.  Skin: Negative.  Neurological: Positive for tremors.  Endo/Heme/Allergies: Negative.  Psychiatric/Behavioral: Negative.    Medical History: History reviewed. No pertinent past medical history.  Patient Active Problem List  Diagnosis  Graves' disease  Thyroglossal duct cyst   Past Surgical History:  Procedure Laterality Date  kidney donation    No Known Allergies  Current Outpatient Medications on File Prior to Visit  Medication Sig Dispense Refill  methIMAzole (TAPAZOLE) 10 MG tablet take 1 tablet Orally twice a day   No current facility-administered medications on file prior to visit.   History reviewed. No pertinent family history.   Social History   Tobacco Use  Smoking Status Never  Smokeless Tobacco Never    Social History   Socioeconomic History  Marital status: Married  Tobacco Use  Smoking status: Never  Smokeless tobacco: Never  Vaping Use  Vaping status: Never Used  Substance and Sexual Activity  Alcohol use: Not Currently  Drug use: Never   Objective:   Vitals:  BP: 118/60  Pulse: 76  Temp: 36.8 C (98.3 F)  SpO2: 98%  Weight: 71.8 kg (158 lb 6.4 oz)  Height: 177.8 cm (5\' 10" )  PainSc: 0-No pain   Body mass index is 22.73 kg/m.  Physical Exam   GENERAL APPEARANCE Comfortable, no acute issues  Development: normal Gross deformities: none  SKIN Rash, lesions, ulcers: none Induration, erythema: none Nodules: none palpable  EYES Conjunctiva and lids: normal Pupils: equal and reactive  EARS, NOSE, MOUTH, THROAT External ears: no lesion or  deformity External nose: no lesion or deformity Hearing: grossly normal  NECK Symmetric: yes Trachea: midline Thyroid: no palpable nodules in the thyroid bed; soft cystic mass at the level of the thyroid notch anteriorly, nontender  ABDOMEN Not assessed  GENITOURINARY/RECTAL Not assessed  MUSCULOSKELETAL Station and gait: normal Digits and nails: no clubbing or cyanosis Muscle strength: grossly normal all extremities Range of motion: grossly normal all extremities Deformity: none; no tremor  LYMPHATIC Cervical: none palpable Supraclavicular: none palpable  PSYCHIATRIC Oriented to person, place, and time: yes Mood and affect: normal for situation Judgment and insight: appropriate for situation   Assessment and Plan:   Graves' disease Thyroglossal duct cyst  Patient is referred by her endocrinologist for consideration for thyroidectomy for management of Graves' disease and concurrent removal of a small thyroglossal duct cyst below the level of the hyoid bone.  Patient provided with a copy of "The Thyroid Book: Medical and Surgical Treatment of Thyroid Problems", published by [employer], 16 pages. Book reviewed and explained to patient during visit today.  Today we reviewed her clinical history. We reviewed her recent laboratory studies. We reviewed the results of her ultrasound and CT scan from 2023. We discussed options for management of Graves' disease including continued antithyroid medications and radioactive iodine ablation. We discussed the pros and cons of proceeding with thyroidectomy. We discussed total thyroidectomy as the procedure of choice. We discussed the risk and benefits of that surgery including the risk of recurrent laryngeal nerve injury and injury to parathyroid glands. We discussed the size and location of the surgical incision. We discussed the hospital stay to be anticipated. We discussed the need for lifelong thyroid hormone replacement after surgery. We  discussed the postoperative recovery and return to work and normal activities. The patient understands and would like to proceed with surgery in the near future. We discussed concurrent resection of the thyroglossal duct cyst through the same incision. I think this is likely very possible.   Darnell Level, MD [facility] A [facility] practice Office: [phone]

## 2023-04-19 NOTE — Anesthesia Preprocedure Evaluation (Addendum)
Anesthesia Evaluation  Patient identified by MRN, date of birth, ID band Patient awake    Reviewed: Allergy & Precautions, H&P , NPO status , Patient's Chart, lab work & pertinent test results  Airway Mallampati: I  TM Distance: >3 FB Neck ROM: Full    Dental no notable dental hx. (+) Teeth Intact, Dental Advisory Given   Pulmonary neg pulmonary ROS   Pulmonary exam normal breath sounds clear to auscultation       Cardiovascular Exercise Tolerance: Good negative cardio ROS  Rhythm:Regular Rate:Normal     Neuro/Psych negative neurological ROS  negative psych ROS   GI/Hepatic negative GI ROS, Neg liver ROS,,,  Endo/Other  negative endocrine ROS    Renal/GU negative Renal ROS  negative genitourinary   Musculoskeletal   Abdominal   Peds  Hematology negative hematology ROS (+)   Anesthesia Other Findings   Reproductive/Obstetrics negative OB ROS                             Anesthesia Physical Anesthesia Plan  ASA: 2  Anesthesia Plan: General   Post-op Pain Management: Tylenol PO (pre-op)*   Induction: Intravenous  PONV Risk Score and Plan: 4 or greater and Ondansetron, Dexamethasone and Midazolam  Airway Management Planned: Oral ETT  Additional Equipment:   Intra-op Plan:   Post-operative Plan: Extubation in OR  Informed Consent: I have reviewed the patients History and Physical, chart, labs and discussed the procedure including the risks, benefits and alternatives for the proposed anesthesia with the patient or authorized representative who has indicated his/her understanding and acceptance.     Dental advisory given  Plan Discussed with: CRNA  Anesthesia Plan Comments:        Anesthesia Quick Evaluation

## 2023-04-21 ENCOUNTER — Other Ambulatory Visit: Payer: Self-pay

## 2023-04-21 ENCOUNTER — Ambulatory Visit (HOSPITAL_COMMUNITY): Payer: Self-pay | Admitting: Anesthesiology

## 2023-04-21 ENCOUNTER — Ambulatory Visit (HOSPITAL_COMMUNITY)
Admission: RE | Admit: 2023-04-21 | Discharge: 2023-04-22 | Disposition: A | Discharge status: Home / Self Care | Payer: 59 | Attending: Surgery | Admitting: Surgery

## 2023-04-21 ENCOUNTER — Encounter (HOSPITAL_COMMUNITY)
Admission: RE | Disposition: A | Discharge status: Home / Self Care | Payer: Self-pay | Source: Home / Self Care | Attending: Surgery

## 2023-04-21 ENCOUNTER — Ambulatory Visit (HOSPITAL_BASED_OUTPATIENT_CLINIC_OR_DEPARTMENT_OTHER): Payer: Self-pay | Admitting: Anesthesiology

## 2023-04-21 ENCOUNTER — Encounter (HOSPITAL_COMMUNITY): Payer: Self-pay | Admitting: Surgery

## 2023-04-21 DIAGNOSIS — Q892 Congenital malformations of other endocrine glands: Secondary | ICD-10-CM

## 2023-04-21 DIAGNOSIS — Z01818 Encounter for other preprocedural examination: Secondary | ICD-10-CM

## 2023-04-21 DIAGNOSIS — E0500 Thyrotoxicosis with diffuse goiter without thyrotoxic crisis or storm: Secondary | ICD-10-CM

## 2023-04-21 HISTORY — PX: THYROIDECTOMY: SHX17

## 2023-04-21 SURGERY — THYROIDECTOMY
Anesthesia: General

## 2023-04-21 MED ORDER — PROPOFOL 10 MG/ML IV BOLUS
INTRAVENOUS | Status: DC | PRN
  Administered 2023-04-21: 150 mg via INTRAVENOUS

## 2023-04-21 MED ORDER — HYDROMORPHONE HCL 1 MG/ML IJ SOLN
0.2500 mg | INTRAMUSCULAR | Status: DC | PRN
  Administered 2023-04-21 (×3): 0.5 mg via INTRAVENOUS

## 2023-04-21 MED ORDER — TRAMADOL HCL 50 MG PO TABS
50.0000 mg | ORAL_TABLET | Freq: Four times a day (QID) | ORAL | Status: DC | PRN
  Administered 2023-04-21: 50 mg via ORAL
  Filled 2023-04-21: qty 1

## 2023-04-21 MED ORDER — CHLORHEXIDINE GLUCONATE CLOTH 2 % EX PADS: 6.0000 | MEDICATED_PAD | Freq: Once | CUTANEOUS | Status: DC

## 2023-04-21 MED ORDER — LIDOCAINE HCL (PF) 2 % IJ SOLN
INTRAMUSCULAR | Status: AC
  Filled 2023-04-21: qty 5

## 2023-04-21 MED ORDER — LIDOCAINE 2% (20 MG/ML) 5 ML SYRINGE
INTRAMUSCULAR | Status: DC | PRN
  Administered 2023-04-21: 60 mg via INTRAVENOUS

## 2023-04-21 MED ORDER — ROCURONIUM BROMIDE 10 MG/ML (PF) SYRINGE
PREFILLED_SYRINGE | INTRAVENOUS | Status: DC | PRN
  Administered 2023-04-21 (×2): 10 mg via INTRAVENOUS
  Administered 2023-04-21: 50 mg via INTRAVENOUS

## 2023-04-21 MED ORDER — SUGAMMADEX SODIUM 200 MG/2ML IV SOLN
INTRAVENOUS | Status: DC | PRN
  Administered 2023-04-21: 150 mg via INTRAVENOUS

## 2023-04-21 MED ORDER — HYDROMORPHONE HCL 1 MG/ML IJ SOLN
INTRAMUSCULAR | Status: AC
  Filled 2023-04-21: qty 1

## 2023-04-21 MED ORDER — CHLORHEXIDINE GLUCONATE 0.12 % MT SOLN
15.0000 mL | Freq: Once | OROMUCOSAL | Status: AC
  Administered 2023-04-21: 15 mL via OROMUCOSAL

## 2023-04-21 MED ORDER — ONDANSETRON 4 MG PO TBDP: 4.0000 mg | ORAL_TABLET | Freq: Four times a day (QID) | ORAL | Status: DC | PRN

## 2023-04-21 MED ORDER — FENTANYL CITRATE (PF) 250 MCG/5ML IJ SOLN
INTRAMUSCULAR | Status: AC
  Filled 2023-04-21: qty 5

## 2023-04-21 MED ORDER — PROPOFOL 10 MG/ML IV BOLUS
INTRAVENOUS | Status: AC
  Filled 2023-04-21: qty 20

## 2023-04-21 MED ORDER — ORAL CARE MOUTH RINSE: 15.0000 mL | Freq: Once | OROMUCOSAL | Status: AC

## 2023-04-21 MED ORDER — ACETAMINOPHEN 500 MG PO TABS
1000.0000 mg | ORAL_TABLET | Freq: Once | ORAL | Status: AC
  Administered 2023-04-21: 1000 mg via ORAL
  Filled 2023-04-21: qty 2

## 2023-04-21 MED ORDER — CALCIUM CARBONATE 1250 (500 CA) MG PO TABS
2.0000 | ORAL_TABLET | Freq: Three times a day (TID) | ORAL | Status: DC
  Administered 2023-04-21: 2500 mg via ORAL
  Filled 2023-04-21: qty 2

## 2023-04-21 MED ORDER — SODIUM CHLORIDE 0.45 % IV SOLN: INTRAVENOUS | Status: DC

## 2023-04-21 MED ORDER — CEFAZOLIN SODIUM-DEXTROSE 2-4 GM/100ML-% IV SOLN
2.0000 g | INTRAVENOUS | Status: AC
  Administered 2023-04-21: 2 g via INTRAVENOUS
  Filled 2023-04-21: qty 100

## 2023-04-21 MED ORDER — MIDAZOLAM HCL 2 MG/2ML IJ SOLN
INTRAMUSCULAR | Status: AC
  Filled 2023-04-21: qty 2

## 2023-04-21 MED ORDER — ACETAMINOPHEN 650 MG RE SUPP: 650.0000 mg | Freq: Four times a day (QID) | RECTAL | Status: DC | PRN

## 2023-04-21 MED ORDER — HEMOSTATIC AGENTS (NO CHARGE) OPTIME
TOPICAL | Status: DC | PRN
  Administered 2023-04-21: 1

## 2023-04-21 MED ORDER — OXYCODONE HCL 5 MG PO TABS: 5.0000 mg | ORAL_TABLET | ORAL | Status: DC | PRN

## 2023-04-21 MED ORDER — SODIUM CHLORIDE 0.9 % IV SOLN
12.5000 mg | Freq: Four times a day (QID) | INTRAVENOUS | Status: DC | PRN
  Administered 2023-04-21: 12.5 mg via INTRAVENOUS
  Filled 2023-04-21: qty 12.5

## 2023-04-21 MED ORDER — DEXAMETHASONE SODIUM PHOSPHATE 10 MG/ML IJ SOLN
INTRAMUSCULAR | Status: DC | PRN
  Administered 2023-04-21: 10 mg via INTRAVENOUS

## 2023-04-21 MED ORDER — ONDANSETRON HCL 4 MG/2ML IJ SOLN
INTRAMUSCULAR | Status: AC
  Filled 2023-04-21: qty 2

## 2023-04-21 MED ORDER — ACETAMINOPHEN 325 MG PO TABS: 650.0000 mg | ORAL_TABLET | Freq: Four times a day (QID) | ORAL | Status: DC | PRN

## 2023-04-21 MED ORDER — LACTATED RINGERS IV SOLN: INTRAVENOUS | Status: DC

## 2023-04-21 MED ORDER — 0.9 % SODIUM CHLORIDE (POUR BTL) OPTIME
TOPICAL | Status: DC | PRN
  Administered 2023-04-21: 1000 mL

## 2023-04-21 MED ORDER — FENTANYL CITRATE (PF) 250 MCG/5ML IJ SOLN
INTRAMUSCULAR | Status: DC | PRN
  Administered 2023-04-21 (×3): 50 ug via INTRAVENOUS
  Administered 2023-04-21: 100 ug via INTRAVENOUS

## 2023-04-21 MED ORDER — HYDROMORPHONE HCL 1 MG/ML IJ SOLN: 1.0000 mg | INTRAMUSCULAR | Status: DC | PRN

## 2023-04-21 MED ORDER — DEXAMETHASONE SODIUM PHOSPHATE 10 MG/ML IJ SOLN
INTRAMUSCULAR | Status: AC
  Filled 2023-04-21: qty 1

## 2023-04-21 MED ORDER — ONDANSETRON HCL 4 MG/2ML IJ SOLN
INTRAMUSCULAR | Status: DC | PRN
Start: 2023-04-21 — End: 2023-04-21
  Administered 2023-04-21: 4 mg via INTRAVENOUS

## 2023-04-21 MED ORDER — ONDANSETRON HCL 4 MG/2ML IJ SOLN
4.0000 mg | Freq: Four times a day (QID) | INTRAMUSCULAR | Status: DC | PRN
  Administered 2023-04-21 (×2): 4 mg via INTRAVENOUS
  Filled 2023-04-21 (×2): qty 2

## 2023-04-21 MED ORDER — ROCURONIUM BROMIDE 10 MG/ML (PF) SYRINGE
PREFILLED_SYRINGE | INTRAVENOUS | Status: AC
  Filled 2023-04-21: qty 10

## 2023-04-21 MED ORDER — MIDAZOLAM HCL 2 MG/2ML IJ SOLN
INTRAMUSCULAR | Status: DC | PRN
  Administered 2023-04-21: 2 mg via INTRAVENOUS

## 2023-04-21 SURGICAL SUPPLY — 34 items
ADH SKN CLS APL DERMABOND .7 (GAUZE/BANDAGES/DRESSINGS) ×1
APL PRP STRL LF DISP 70% ISPRP (MISCELLANEOUS) ×1
ATTRACTOMAT 16X20 MAGNETIC DRP (DRAPES) ×1 IMPLANT
BAG COUNTER SPONGE SURGICOUNT (BAG) ×1 IMPLANT
BAG SPNG CNTER NS LX DISP (BAG) ×1
BLADE SURG 15 STRL LF DISP TIS (BLADE) ×1 IMPLANT
BLADE SURG 15 STRL SS (BLADE) ×1
CHLORAPREP W/TINT 26 (MISCELLANEOUS) ×1 IMPLANT
CLIP TI MEDIUM 6 (CLIP) ×2 IMPLANT
CLIP TI WIDE RED SMALL 6 (CLIP) ×2 IMPLANT
COVER SURGICAL LIGHT HANDLE (MISCELLANEOUS) ×1 IMPLANT
DERMABOND ADVANCED .7 DNX12 (GAUZE/BANDAGES/DRESSINGS) ×1 IMPLANT
DRAPE LAPAROTOMY T 98X78 PEDS (DRAPES) ×1 IMPLANT
DRAPE UTILITY XL STRL (DRAPES) ×1 IMPLANT
ELECT PENCIL ROCKER SW 15FT (MISCELLANEOUS) ×1 IMPLANT
ELECT REM PT RETURN 15FT ADLT (MISCELLANEOUS) ×1 IMPLANT
GAUZE 4X4 16PLY ~~LOC~~+RFID DBL (SPONGE) ×1 IMPLANT
GLOVE SURG ORTHO 8.0 STRL STRW (GLOVE) ×1 IMPLANT
GOWN STRL REUS W/ TWL XL LVL3 (GOWN DISPOSABLE) ×2 IMPLANT
GOWN STRL REUS W/TWL XL LVL3 (GOWN DISPOSABLE) ×2
HEMOSTAT SNOW SURGICEL 2X4 (HEMOSTASIS) IMPLANT
HEMOSTAT SURGICEL 2X4 FIBR (HEMOSTASIS) ×1 IMPLANT
ILLUMINATOR WAVEGUIDE N/F (MISCELLANEOUS) ×1 IMPLANT
KIT BASIN OR (CUSTOM PROCEDURE TRAY) ×1 IMPLANT
KIT TURNOVER KIT A (KITS) IMPLANT
PACK BASIC VI WITH GOWN DISP (CUSTOM PROCEDURE TRAY) ×1 IMPLANT
SHEARS HARMONIC 9CM CVD (BLADE) ×1 IMPLANT
SUT MNCRL AB 4-0 PS2 18 (SUTURE) ×1 IMPLANT
SUT SILK 3 0 SH 30 (SUTURE) ×1 IMPLANT
SUT VIC AB 3-0 SH 18 (SUTURE) ×2 IMPLANT
SYR BULB IRRIG 60ML STRL (SYRINGE) ×1 IMPLANT
TOWEL OR 17X26 10 PK STRL BLUE (TOWEL DISPOSABLE) ×1 IMPLANT
TOWEL OR NON WOVEN STRL DISP B (DISPOSABLE) ×1 IMPLANT
TUBING CONNECTING 10 (TUBING) ×1 IMPLANT

## 2023-04-21 NOTE — Transfer of Care (Signed)
Immediate Anesthesia Transfer of Care Note  Patient: Kim Cobb  Procedure(s) Performed: TOTAL THYROIDECTOMY WITH RESECTION OF THYROGLOSSAL DUCT CYST  Patient Location: PACU  Anesthesia Type:General  Level of Consciousness: drowsy  Airway & Oxygen Therapy: Patient Spontanous Breathing and Patient connected to face mask oxygen  Post-op Assessment: Report given to RN and Post -op Vital signs reviewed and stable  Post vital signs: Reviewed and stable  Last Vitals:  Vitals Value Taken Time  BP    Temp    Pulse    Resp    SpO2      Last Pain:  Vitals:   04/21/23 0557  TempSrc: Oral         Complications: No notable events documented.

## 2023-04-21 NOTE — Anesthesia Postprocedure Evaluation (Signed)
Anesthesia Post Note  Patient: Kim Cobb  Procedure(s) Performed: TOTAL THYROIDECTOMY WITH RESECTION OF THYROGLOSSAL DUCT CYST     Patient location during evaluation: PACU Anesthesia Type: General Level of consciousness: awake and alert Pain management: pain level controlled Vital Signs Assessment: post-procedure vital signs reviewed and stable Respiratory status: spontaneous breathing, nonlabored ventilation and respiratory function stable Cardiovascular status: blood pressure returned to baseline and stable Postop Assessment: no apparent nausea or vomiting Anesthetic complications: no  No notable events documented.  Last Vitals:  Vitals:   04/21/23 1015 04/21/23 1030  BP: 134/85 132/85  Pulse: 81 79  Resp: 13 13  Temp:    SpO2: 96% 94%    Last Pain:  Vitals:   04/21/23 1030  TempSrc:   PainSc: Asleep                 Cale Bethard,W. EDMOND

## 2023-04-21 NOTE — Interval H&P Note (Signed)
History and Physical Interval Note:  04/21/2023 7:10 AM  Kim Cobb  has presented today for surgery, with the diagnosis of GRAVES DISEASE THYROGLOSSA DUCT CYST.  The various methods of treatment have been discussed with the patient and family. After consideration of risks, benefits and other options for treatment, the patient has consented to    Procedure(s): TOTAL THYROIDECTOMY WITH RESECTION OF THYROGLOSSAL DUCT CYST (N/A) as a surgical intervention.    The patient's history has been reviewed, patient examined, no change in status, stable for surgery.  I have reviewed the patient's chart and labs.  Questions were answered to the patient's satisfaction.    Darnell Level, MD [facility] practice Office: [phone]   Darnell Level

## 2023-04-21 NOTE — Anesthesia Procedure Notes (Signed)
Procedure Name: Intubation Date/Time: 04/21/2023 7:29 AM  Performed by: Florene Route, CRNAPre-anesthesia Checklist: Patient identified, Emergency Drugs available, Suction available and Patient being monitored Patient Re-evaluated:Patient Re-evaluated prior to induction Oxygen Delivery Method: Circle system utilized Preoxygenation: Pre-oxygenation with 100% oxygen Induction Type: IV induction Ventilation: Mask ventilation without difficulty Laryngoscope Size: Miller and 2 Grade View: Grade I Tube type: Oral Tube size: 7.0 mm Number of attempts: 1 Airway Equipment and Method: Stylet and Oral airway Placement Confirmation: ETT inserted through vocal cords under direct vision, positive ETCO2 and breath sounds checked- equal and bilateral Secured at: 22 cm Tube secured with: Tape Dental Injury: Teeth and Oropharynx as per pre-operative assessment

## 2023-04-21 NOTE — Op Note (Signed)
Procedure Note  Pre-operative Diagnosis:  Graves' disease, thyroglossal duct cyst  Post-operative Diagnosis:  same  Surgeon:  Darnell Level, MD  Assistant:  Reatha Harps, MD   Procedure:  1. Total thyroidectomy;  2. Resection thyroglossal duct cyst  Anesthesia:  General  Estimated Blood Loss:  25 cc  Drains: none         Specimen: thyroid to pathology with thyroglossal duct cyst, en bloc  Indications:  Patient is referred by Dr. Dorisann Frames for surgical evaluation and management of Graves' disease. Patient's primary care physician is Dr. Merri Brunette. Patient developed signs and symptoms of Graves' disease around the first of the year. She developed a significant tremor. She experienced approximately a 30 pound weight loss. She was evaluated by her primary care physician and diagnosed with Graves' disease. She was referred to her endocrinologist and started on methimazole. Patient's dosage has been decreased to 10 mg daily which is controlling her symptoms. She has discussed options for management including radioactive iodine ablation. Patient is a kidney donor in 2018. She is concerned about doing radioactive iodine. Also the patient had been found on examination to have a cyst in the upper midline of the anterior neck. She underwent a CT scan in October 2023 which shows a 2.3 cm cyst with a small amount of soft tissue density in the midline below the level of the hyoid bone consistent with a thyroglossal duct cyst. She was seen by ENT and made a decision not to have this surgically resected. She has no history of infection. However, now that she is considering thyroidectomy for management of Graves' disease, she is interested in having the cyst resected concurrently. Patient has no prior history of head or neck surgery. She has no prior history of thyroid disease. There is no family history of thyroid disease. Patient presents today accompanied by her husband. Patient did undergo a thyroid  ultrasound in September 2023. This showed an essentially normal sized thyroid gland. There were 2 small nodules located in both thyroid lobes which were either cystic or did not meet criteria for biopsy.   Procedure Details: Procedure was done in OR #2 at the [facility]. The patient was brought to the operating room and placed in a supine position on the operating room table. Following administration of general anesthesia, the patient was positioned and then prepped and draped in the usual aseptic fashion. After ascertaining that an adequate level of anesthesia had been achieved, a small Kocher incision was made with #15 blade. Dissection was carried through subcutaneous tissues and platysma.Hemostasis was achieved with the electrocautery. Skin flaps were elevated cephalad and caudad from the thyroid notch to the sternal notch. A Mahorner self-retaining retractor was placed for exposure. Strap muscles were incised in the midline and dissection was begun on the left side.  Strap muscles were reflected laterally.  Left thyroid lobe was mildly enlarged without dominant nodules.  The left lobe was gently mobilized with blunt dissection. Superior pole vessels were dissected out and divided individually between small and medium ligaclips with the harmonic scalpel. The thyroid lobe was rolled anteriorly. Branches of the inferior thyroid artery were divided between small ligaclips with the harmonic scalpel. Inferior venous tributaries were divided between ligaclips. Both the superior and inferior parathyroid glands were identified and preserved on their vascular pedicles. The recurrent laryngeal nerve was identified and preserved along its course. The ligament of Allyson Sabal was released with the electrocautery and the gland was mobilized onto the anterior trachea. Isthmus  was mobilized across the midline.   There was a thin pyramidal lobe extending cephalad.  This was gently mobilized using the harmonic scalpel.   Dissection was carried cephalad in the midline to the level of the thyroid cartilage.  Anterior to the thyroid notch was a cystic mass consistent with thyroglossal duct cyst.  This measured approximately 2.0 cm in diameter.  It was gently dissected out using the harmonic scalpel for resection.  The entire cyst was excised en bloc with the pyramidal lobe.  Hemostasis was achieved with the electrocautery and the harmonic scalpel.  The right thyroid lobe was gently mobilized with blunt dissection. Right thyroid lobe was mildly enlarged. Superior pole vessels were dissected out and divided between small and medium ligaclips with the Harmonic scalpel. Superior parathyroid was identified and preserved. Inferior venous tributaries were divided between medium ligaclips with the harmonic scalpel. The right thyroid lobe was rolled anteriorly and the branches of the inferior thyroid artery divided between small ligaclips. The right recurrent laryngeal nerve was identified and preserved along its course. The ligament of Allyson Sabal was released with the electrocautery. The right thyroid lobe was mobilized onto the anterior trachea and the remainder of the thyroid was dissected off the anterior trachea and the thyroid was completely excised. A single suture was used to mark the left thyroid lobe. A double suture was used to mark the thyroglossal duct cyst.  The entire thyroid gland was submitted to pathology for review.  Palpation of the operative field demonstrated no evidence of residual disease and no abnormal lymph nodes.  The neck was irrigated with warm saline. Fibrillar was placed throughout the operative field. Strap muscles were approximated in the midline with interrupted 3-0 Vicryl sutures. Platysma was closed with interrupted 3-0 Vicryl sutures. Skin was closed with a running 4-0 Monocryl subcuticular suture. Wound was washed and Dermabond was applied. The patient was awakened from anesthesia and brought to the  recovery room. The patient tolerated the procedure well.   Darnell Level, MD [facility] Office: [phone]

## 2023-04-22 ENCOUNTER — Encounter (HOSPITAL_COMMUNITY): Payer: Self-pay | Admitting: Surgery

## 2023-04-22 DIAGNOSIS — Q892 Congenital malformations of other endocrine glands: Secondary | ICD-10-CM | POA: Diagnosis not present

## 2023-04-22 DIAGNOSIS — E0500 Thyrotoxicosis with diffuse goiter without thyrotoxic crisis or storm: Secondary | ICD-10-CM | POA: Diagnosis not present

## 2023-04-22 LAB — SURGICAL PATHOLOGY

## 2023-04-22 LAB — CALCIUM: Calcium: 8.1 mg/dL — ABNORMAL LOW (ref 8.9–10.3)

## 2023-04-22 MED ORDER — TRAMADOL HCL 50 MG PO TABS: 50.0000 mg | ORAL_TABLET | Freq: Four times a day (QID) | ORAL | 0 refills | Status: AC | PRN

## 2023-04-22 MED ORDER — CALCIUM CARBONATE ANTACID 500 MG PO CHEW: 2.0000 | CHEWABLE_TABLET | Freq: Three times a day (TID) | ORAL | 1 refills | Status: AC

## 2023-04-22 MED ORDER — CALCIUM GLUCONATE-NACL 2-0.675 GM/100ML-% IV SOLN
2.0000 g | INTRAVENOUS | Status: AC
  Administered 2023-04-22: 2000 mg via INTRAVENOUS
  Filled 2023-04-22: qty 100

## 2023-04-22 NOTE — Discharge Instructions (Signed)
[facility] - Dr. Darnell Level  THYROID & PARATHYROID SURGERY:  POST-OP INSTRUCTIONS  Always review the instruction sheet provided by the hospital nurse at discharge.  A prescription for pain medication may be sent to your pharmacy at the time of discharge.  Take your pain medication as prescribed.  If narcotic pain medicine is not needed, then you may take acetaminophen (Tylenol) or ibuprofen (Advil) as needed for pain or soreness.  Take your normal home medications as prescribed unless otherwise directed.  If you need a refill on your pain medication, please contact the office during regular business hours.  Prescriptions will not be processed by the office after 5:00PM or on weekends.  Start with a light diet upon arrival home, such as soup and crackers or toast.  Be sure to drink plenty of fluids.  Resume your normal diet the day after surgery.  Most patients will experience some swelling and bruising on the chest and neck area.  Ice packs will help for the first 48 hours after arriving home.  Swelling and bruising will take several days to resolve.   It is common to experience some constipation after surgery.  Increasing fluid intake and taking a stool softener (Colace) will usually help to prevent this problem.  A mild laxative (Milk of Magnesia or Miralax) should be taken according to package directions if there has been no bowel movement after 48 hours.  Dermabond glue covers your incision. This seals the wound and you may shower at any time. The Dermabond will remain in place for about a week.  You may gradually remove the glue when it loosens around the edges.  If you need to loosen the Dermabond for removal, apply a layer of Vaseline to the wound for 15 minutes and then remove with a Kleenex. Your sutures are under the skin and will not show - they will dissolve on their own.  You may resume light daily activities beginning the day after discharge (such as self-care,  walking, climbing stairs), gradually increasing activities as tolerated. You may have sexual intercourse when it is comfortable. Refrain from any heavy lifting or straining until approved by your doctor. You may drive when you no longer are taking prescription pain medication, you can comfortably wear a seatbelt, and you can safely maneuver your car and apply the brakes.  You will see your doctor in the office for a follow-up appointment approximately three weeks after your surgery.  Make sure that you call for this appointment within a day or two after you arrive home to insure a convenient appointment time. Please have any requested laboratory tests performed a few days prior to your office visit so that the results will be available at your follow up appointment.  WHEN TO CALL THE [facility] OFFICE: -- Fever greater than 101.5 -- Inability to urinate -- Nausea and/or vomiting - persistent -- Extreme swelling or bruising -- Continued bleeding from incision -- Increased pain, redness, or drainage from the incision -- Difficulty swallowing or breathing -- Muscle cramping or spasms -- Numbness or tingling in hands or around lips  The clinic staff is available to answer your questions during regular business hours.  Please don't hesitate to call and ask to speak to one of the nurses if you have concerns.  [facility] OFFICE: [phone] (24 hours)  Please sign up for MyChart accounts. This will allow you to communicate directly with my nurse or myself without having to call the office. It will also allow you  to view your test results. You will need to enroll in MyChart for my office ([facility]) and for the hospital [facility]).  Darnell Level, MD [facility] A [facility] practice

## 2023-04-22 NOTE — Progress Notes (Signed)
Discharge instructions discussed with patient, verbalized agreement and understanding

## 2023-04-22 NOTE — Progress Notes (Signed)
   04/22/23 0845  TOC Brief Assessment  Insurance and Status Reviewed  Patient has primary care physician Yes  Home environment has been reviewed Resides at home with spouse  Prior level of function: Independent at baseline  Prior/Current Home Services No current home services  Social Determinants of Health Reivew SDOH reviewed no interventions necessary  Readmission risk has been reviewed Yes  Transition of care needs no transition of care needs at this time

## 2023-04-22 NOTE — Plan of Care (Signed)
Problem: Activity: Goal: Risk for activity intolerance will decrease Outcome: Progressing   Problem: Nutrition: Goal: Adequate nutrition will be maintained Outcome: Adequate for Discharge   Problem: Elimination: Goal: Will not experience complications related to urinary retention Outcome: Adequate for Discharge

## 2023-04-22 NOTE — Progress Notes (Signed)
Pathology results are all benign, as expected.  Darnell Level, MD [facility] practice Office: [phone]

## 2023-04-22 NOTE — Discharge Summary (Signed)
Physician Discharge Summary   Patient ID: Kim Cobb MRN: [id number] DOB/AGE: [birth year] 62 y.o.  Admit date: 04/21/2023  Discharge date: 04/22/2023  Discharge Diagnoses:  Principal Problem:   Graves' disease Active Problems:   Thyroglossal duct cyst   Graves disease   Discharged Condition: good  Hospital Course: Patient was admitted for observation following thyroid surgery.  Post op course was uncomplicated.  Pain was well controlled.  Tolerated diet.  Post op calcium level on morning following surgery was 8.1 mg/dl.  Patient given infusion of calcium gluconate 2gm IVPB prior to discharge home.  Patient was prepared for discharge home on POD#1.  Consults: None  Treatments: surgery: total thyroidectomy, resection of thyroglossal duct cyst  Discharge Exam: Blood pressure 102/84, pulse 73, temperature 98 F (36.7 C), temperature source Oral, resp. rate 16, height 5\' 10"  (1.778 m), weight 73.5 kg, last menstrual period 07/29/2014, SpO2 99%. HEENT - clear Neck - wound dry and intact; mild STS and ecchymosis; voice soft; Dermabond in place  Disposition: Home  Discharge Instructions     Diet - low sodium heart healthy   Complete by: As directed    Increase activity slowly   Complete by: As directed    No dressing needed   Complete by: As directed       Allergies as of 04/22/2023   No Known Allergies      Medication List     TAKE these medications    calcium carbonate 500 MG chewable tablet Commonly known as: Tums Chew 2 tablets (400 mg of elemental calcium total) by mouth 3 (three) times daily.   CALCIUM CITRATE PO Take 1,000 mg by mouth daily.   MAGNESIUM CITRATE PO Take 150 mg by mouth daily.   Multi-Vitamin tablet Take 1 tablet by mouth daily.   traMADol 50 MG tablet Commonly known as: ULTRAM Take 1 tablet (50 mg total) by mouth every 6 (six) hours as needed for moderate pain.   Vitamin D3 50 MCG (2000 UT) capsule Take 2,000 Units by mouth  daily.               Discharge Care Instructions  (From admission, onward)           Start     Ordered   04/22/23 0000  No dressing needed        04/22/23 1884            Follow-up Information     Darnell Level, MD. Schedule an appointment as soon as possible for a visit in 3 week(s).   Specialty: General Surgery Why: For wound re-check Contact information: [address] [place] [address] [phone]                 Darnell Level, MD [facility] Office: [phone]   Signed: Darnell Level 04/22/2023, 7:48 AM

## 2023-04-23 DIAGNOSIS — E890 Postprocedural hypothyroidism: Secondary | ICD-10-CM | POA: Diagnosis not present

## 2023-04-23 DIAGNOSIS — E0500 Thyrotoxicosis with diffuse goiter without thyrotoxic crisis or storm: Secondary | ICD-10-CM | POA: Diagnosis not present

## 2023-04-23 DIAGNOSIS — Q892 Congenital malformations of other endocrine glands: Secondary | ICD-10-CM | POA: Diagnosis not present

## 2023-04-24 DIAGNOSIS — D225 Melanocytic nevi of trunk: Secondary | ICD-10-CM | POA: Diagnosis not present

## 2023-04-24 DIAGNOSIS — L814 Other melanin hyperpigmentation: Secondary | ICD-10-CM | POA: Diagnosis not present

## 2023-04-24 DIAGNOSIS — L821 Other seborrheic keratosis: Secondary | ICD-10-CM | POA: Diagnosis not present

## 2023-04-24 DIAGNOSIS — Z7189 Other specified counseling: Secondary | ICD-10-CM | POA: Diagnosis not present

## 2023-05-15 DIAGNOSIS — E890 Postprocedural hypothyroidism: Secondary | ICD-10-CM | POA: Diagnosis not present

## 2023-05-15 DIAGNOSIS — Q892 Congenital malformations of other endocrine glands: Secondary | ICD-10-CM | POA: Diagnosis not present

## 2023-05-15 DIAGNOSIS — E0500 Thyrotoxicosis with diffuse goiter without thyrotoxic crisis or storm: Secondary | ICD-10-CM | POA: Diagnosis not present

## 2023-05-23 DIAGNOSIS — E890 Postprocedural hypothyroidism: Secondary | ICD-10-CM | POA: Diagnosis not present

## 2023-05-28 DIAGNOSIS — E890 Postprocedural hypothyroidism: Secondary | ICD-10-CM | POA: Diagnosis not present

## 2023-06-18 DIAGNOSIS — Z01419 Encounter for gynecological examination (general) (routine) without abnormal findings: Secondary | ICD-10-CM | POA: Diagnosis not present

## 2023-06-18 DIAGNOSIS — Z113 Encounter for screening for infections with a predominantly sexual mode of transmission: Secondary | ICD-10-CM | POA: Diagnosis not present

## 2023-06-18 DIAGNOSIS — Z1231 Encounter for screening mammogram for malignant neoplasm of breast: Secondary | ICD-10-CM | POA: Diagnosis not present

## 2023-06-18 DIAGNOSIS — Z124 Encounter for screening for malignant neoplasm of cervix: Secondary | ICD-10-CM | POA: Diagnosis not present

## 2023-06-18 DIAGNOSIS — Z01411 Encounter for gynecological examination (general) (routine) with abnormal findings: Secondary | ICD-10-CM | POA: Diagnosis not present

## 2023-06-18 DIAGNOSIS — Z1331 Encounter for screening for depression: Secondary | ICD-10-CM | POA: Diagnosis not present

## 2023-07-09 DIAGNOSIS — E0590 Thyrotoxicosis, unspecified without thyrotoxic crisis or storm: Secondary | ICD-10-CM | POA: Diagnosis not present

## 2023-07-09 DIAGNOSIS — E559 Vitamin D deficiency, unspecified: Secondary | ICD-10-CM | POA: Diagnosis not present

## 2023-07-09 DIAGNOSIS — E7800 Pure hypercholesterolemia, unspecified: Secondary | ICD-10-CM | POA: Diagnosis not present

## 2023-07-29 DIAGNOSIS — B351 Tinea unguium: Secondary | ICD-10-CM | POA: Diagnosis not present

## 2023-08-21 DIAGNOSIS — E7800 Pure hypercholesterolemia, unspecified: Secondary | ICD-10-CM | POA: Diagnosis not present

## 2023-08-21 DIAGNOSIS — Z0000 Encounter for general adult medical examination without abnormal findings: Secondary | ICD-10-CM | POA: Diagnosis not present

## 2023-08-21 DIAGNOSIS — N289 Disorder of kidney and ureter, unspecified: Secondary | ICD-10-CM | POA: Diagnosis not present

## 2023-08-21 DIAGNOSIS — E559 Vitamin D deficiency, unspecified: Secondary | ICD-10-CM | POA: Diagnosis not present

## 2023-08-21 DIAGNOSIS — E0590 Thyrotoxicosis, unspecified without thyrotoxic crisis or storm: Secondary | ICD-10-CM | POA: Diagnosis not present

## 2023-08-25 DIAGNOSIS — E559 Vitamin D deficiency, unspecified: Secondary | ICD-10-CM | POA: Diagnosis not present

## 2023-08-25 DIAGNOSIS — E890 Postprocedural hypothyroidism: Secondary | ICD-10-CM | POA: Diagnosis not present

## 2023-08-25 DIAGNOSIS — N289 Disorder of kidney and ureter, unspecified: Secondary | ICD-10-CM | POA: Diagnosis not present

## 2023-08-25 DIAGNOSIS — R252 Cramp and spasm: Secondary | ICD-10-CM | POA: Diagnosis not present

## 2023-08-25 DIAGNOSIS — Z8509 Personal history of malignant neoplasm of other digestive organs: Secondary | ICD-10-CM | POA: Diagnosis not present

## 2023-08-25 DIAGNOSIS — Z524 Kidney donor: Secondary | ICD-10-CM | POA: Diagnosis not present

## 2023-08-25 DIAGNOSIS — Z0000 Encounter for general adult medical examination without abnormal findings: Secondary | ICD-10-CM | POA: Diagnosis not present

## 2023-08-28 DIAGNOSIS — R252 Cramp and spasm: Secondary | ICD-10-CM | POA: Diagnosis not present

## 2023-08-28 DIAGNOSIS — E890 Postprocedural hypothyroidism: Secondary | ICD-10-CM | POA: Diagnosis not present

## 2023-08-31 DIAGNOSIS — Z1211 Encounter for screening for malignant neoplasm of colon: Secondary | ICD-10-CM | POA: Diagnosis not present

## 2023-08-31 DIAGNOSIS — Z1212 Encounter for screening for malignant neoplasm of rectum: Secondary | ICD-10-CM | POA: Diagnosis not present

## 2023-10-07 DIAGNOSIS — E890 Postprocedural hypothyroidism: Secondary | ICD-10-CM | POA: Diagnosis not present

## 2023-10-07 DIAGNOSIS — R252 Cramp and spasm: Secondary | ICD-10-CM | POA: Diagnosis not present

## 2023-11-25 DIAGNOSIS — B351 Tinea unguium: Secondary | ICD-10-CM | POA: Diagnosis not present

## 2023-12-18 DIAGNOSIS — E0500 Thyrotoxicosis with diffuse goiter without thyrotoxic crisis or storm: Secondary | ICD-10-CM | POA: Diagnosis not present

## 2024-03-02 DIAGNOSIS — B351 Tinea unguium: Secondary | ICD-10-CM | POA: Diagnosis not present

## 2024-03-04 DIAGNOSIS — E0500 Thyrotoxicosis with diffuse goiter without thyrotoxic crisis or storm: Secondary | ICD-10-CM | POA: Diagnosis not present

## 2024-03-04 DIAGNOSIS — E890 Postprocedural hypothyroidism: Secondary | ICD-10-CM | POA: Diagnosis not present

## 2024-03-04 DIAGNOSIS — E7800 Pure hypercholesterolemia, unspecified: Secondary | ICD-10-CM | POA: Diagnosis not present

## 2024-03-10 DIAGNOSIS — E890 Postprocedural hypothyroidism: Secondary | ICD-10-CM | POA: Diagnosis not present

## 2024-06-21 DIAGNOSIS — Z1231 Encounter for screening mammogram for malignant neoplasm of breast: Secondary | ICD-10-CM | POA: Diagnosis not present

## 2024-07-05 DIAGNOSIS — B351 Tinea unguium: Secondary | ICD-10-CM | POA: Diagnosis not present
